# Patient Record
Sex: Female | Born: 1960 | Race: White | Hispanic: No | Marital: Married | Smoking: Former smoker
Health system: Southern US, Community
[De-identification: ages and names within clinical notes are randomized; demographics above are authoritative.]

## PROBLEM LIST (undated history)

## (undated) DIAGNOSIS — Z8659 Personal history of other mental and behavioral disorders: Secondary | ICD-10-CM

## (undated) DIAGNOSIS — I10 Essential (primary) hypertension: Secondary | ICD-10-CM

## (undated) DIAGNOSIS — Z87442 Personal history of urinary calculi: Secondary | ICD-10-CM

## (undated) DIAGNOSIS — M4800 Spinal stenosis, site unspecified: Secondary | ICD-10-CM

## (undated) DIAGNOSIS — I639 Cerebral infarction, unspecified: Secondary | ICD-10-CM

## (undated) DIAGNOSIS — R0789 Other chest pain: Secondary | ICD-10-CM

## (undated) DIAGNOSIS — M1990 Unspecified osteoarthritis, unspecified site: Secondary | ICD-10-CM

## (undated) HISTORY — DX: Spinal stenosis, site unspecified: M48.00

## (undated) HISTORY — DX: Essential (primary) hypertension: I10

## (undated) HISTORY — DX: Other chest pain: R07.89

## (undated) HISTORY — DX: Unspecified osteoarthritis, unspecified site: M19.90

## (undated) HISTORY — PX: ROTATOR CUFF REPAIR: SHX139

---

## 2004-11-09 ENCOUNTER — Ambulatory Visit: Payer: Self-pay | Admitting: Family Medicine

## 2008-04-30 ENCOUNTER — Ambulatory Visit: Payer: Self-pay | Admitting: Unknown Physician Specialty

## 2008-05-03 ENCOUNTER — Ambulatory Visit: Payer: Self-pay | Admitting: Unknown Physician Specialty

## 2008-05-17 ENCOUNTER — Ambulatory Visit: Payer: Self-pay | Admitting: Unknown Physician Specialty

## 2008-05-28 ENCOUNTER — Ambulatory Visit: Payer: Self-pay | Admitting: Unknown Physician Specialty

## 2008-07-19 HISTORY — PX: BREAST CYST EXCISION: SHX579

## 2008-07-19 HISTORY — PX: NOVASURE ABLATION: SHX5394

## 2009-05-13 ENCOUNTER — Ambulatory Visit: Payer: Self-pay | Admitting: Unknown Physician Specialty

## 2010-05-02 ENCOUNTER — Emergency Department: Payer: Self-pay | Admitting: Unknown Physician Specialty

## 2011-06-24 ENCOUNTER — Ambulatory Visit: Payer: Self-pay | Admitting: Physician Assistant

## 2012-07-24 ENCOUNTER — Ambulatory Visit: Payer: Self-pay | Admitting: Obstetrics & Gynecology

## 2012-11-06 ENCOUNTER — Encounter: Payer: Self-pay | Admitting: General Surgery

## 2012-11-06 ENCOUNTER — Ambulatory Visit (INDEPENDENT_AMBULATORY_CARE_PROVIDER_SITE_OTHER): Payer: BC Managed Care – PPO | Admitting: General Surgery

## 2012-11-06 VITALS — BP 122/82 | HR 60 | Resp 12 | Ht 59.0 in | Wt 133.0 lb

## 2012-11-06 DIAGNOSIS — S301XXA Contusion of abdominal wall, initial encounter: Secondary | ICD-10-CM

## 2012-11-06 NOTE — Progress Notes (Signed)
Patient ID: Meredith Weber, female   DOB: 1960-08-30, 52 y.o.   MRN: [id number]  Chief Complaint  Patient presents with  . abdominal hernia    HPI Meredith Weber is a 52 y.o. female here today for evaluation of an possible hernia. The patient states she noticed a knot in March 27,2014. She states the area was bruised and tender to the touch.The day prior to noticing the knot she states she was trying to pushing a roll of carpet to get it off the floor at work. The tenderness is still present but the bruise is gone. She notices if she strains the area is more noticeable. She is having a sensation of bloating but denies constipation. Her bowels seems be slower since this accident. No studies have been done at this point. She was seen at [facility] by Dr. Zachery Dauer who has put her on a 10 lb weight restriction until she could be seen by our office.  HPI  Past Medical History  Diagnosis Date  . Spinal stenosis     Past Surgical History  Procedure Laterality Date  . Cesarean section  1984  . Breast cyst excision Left 2010  . Novasure ablation  2010    History reviewed. No pertinent family history.  Social History History  Substance Use Topics  . Smoking status: Former Smoker -- 1.00 packs/day  . Smokeless tobacco: Current User  . Alcohol Use: Yes    No Known Allergies  Current Outpatient Prescriptions  Medication Sig Dispense Refill  . CAMILA 0.35 MG tablet Take 1 tablet by mouth daily.      . fluticasone (FLONASE) 50 MCG/ACT nasal spray Place 1 spray into the nose 2 (two) times daily.       No current facility-administered medications for this visit.    Review of Systems Review of Systems  Constitutional: Negative.   Respiratory: Negative.   Cardiovascular: Negative.   Gastrointestinal: Positive for abdominal pain.    Blood pressure 122/82, pulse 60, resp. rate 12, height 4\' 11"  (1.499 m), weight 133 lb (60.328 kg).  Physical Exam Physical Exam  Constitutional: She  appears well-developed and well-nourished.  Neck: Trachea normal. No mass and no thyromegaly present.  Cardiovascular: Normal rate, regular rhythm, normal heart sounds and normal pulses.   No murmur heard. Pulmonary/Chest: Effort normal and breath sounds normal.  Abdominal: Soft. Normal appearance and bowel sounds are normal. There is tenderness. No hernia.  Faint residual bruising consistent with a resolving hematoma of the lower abdomen. Abdominal examination was completed in the supine and standing position. No evidence of fascial defect.  Data Reviewed none  Assessment    Abdominal wall hematoma, resolving. No evidence of abdominal wall hernia.     Plan    Conservative treatment has been recommended: Local heat as well as making use of Aleve, 2 tablets b.i.d. For the next 2 weeks. I would anticipate her symptoms will continue to improve.  Should her symptoms persist we'll arrange for CT scan of the abdomen and pelvis.       Meredith Weber 11/07/2012, 9:09 PM  Cc: Richardean Canal, M.D.; [facility].

## 2012-11-06 NOTE — Patient Instructions (Signed)
Patient to return in 3 weeks. She is advised to use Aleve 2 tabs twice daily for 1 week, then 1 tab twice a day until return appointment. She is also instructed to use heat. No restrictions. Patient to use good judgement when lifting, pushing, and pulling.

## 2012-11-07 ENCOUNTER — Encounter: Payer: Self-pay | Admitting: General Surgery

## 2012-11-07 DIAGNOSIS — S301XXA Contusion of abdominal wall, initial encounter: Secondary | ICD-10-CM | POA: Insufficient documentation

## 2012-12-06 ENCOUNTER — Encounter: Payer: Self-pay | Admitting: General Surgery

## 2012-12-06 ENCOUNTER — Ambulatory Visit (INDEPENDENT_AMBULATORY_CARE_PROVIDER_SITE_OTHER): Payer: BC Managed Care – PPO | Admitting: General Surgery

## 2012-12-06 VITALS — BP 152/90 | HR 72 | Resp 12 | Ht 64.0 in | Wt 132.0 lb

## 2012-12-06 DIAGNOSIS — S301XXA Contusion of abdominal wall, initial encounter: Secondary | ICD-10-CM

## 2012-12-06 NOTE — Progress Notes (Signed)
Patient ID: Meredith Weber, female   DO: [birth year], 52 y.o.   MRN: [id number]  Chief Complaint  Patient presents with  . Other    muscle     HPI Meredith Weber is a 52 y.o. female here today for an follow up muscle stain in the abdominal area. Patient still feels a knot.the patient reports that this is more of a small roll at the lower aspect of the abdomen rather a firm nodule. Minimal discomfort in the area. In general, marked improvement in her abdominal wall pain. Ecchymosis is now completely resolved.  HPI  Past Medical History  Diagnosis Date  . Spinal stenosis     Past Surgical History  Procedure Laterality Date  . Cesarean section  1984  . Breast cyst excision Left 2010  . Novasure ablation  2010    No family history on file.  Social History History  Substance Use Topics  . Smoking status: Former Smoker -- 1.00 packs/day  . Smokeless tobacco: Current User  . Alcohol Use: Yes    No Known Allergies  Current Outpatient Prescriptions  Medication Sig Dispense Refill  . CAMILA 0.35 MG tablet Take 1 tablet by mouth daily.      . fluticasone (FLONASE) 50 MCG/ACT nasal spray Place 1 spray into the nose 2 (two) times daily.       No current facility-administered medications for this visit.    Review of Systems Review of Systems  Constitutional: Negative.   Respiratory: Negative.   Cardiovascular: Negative.     Blood pressure 152/90, pulse 72, resp. rate 12, height 5\' 4"  (1.626 m), weight 132 lb (59.875 kg).  Physical Exam Physical Exam  Constitutional: She appears well-developed and well-nourished.  The abdomen is soft and nontender. No focal mass effect. Examination of the supine and standing position shows no discernible fascial defect. A small area of thickened adipose tissue consistent with a hematoma side is identified in the lower midline.  Data Reviewed None  Assessment    Resolving abdominal wall hematoma. No evidence of ventral hernia.     Plan   The patient has been encouraged to make use of local heat and Aleve as needed for comfort. This will also help with the musculoskeletal symptom she is experiencing in the sacroiliac area. At this time, I see nothing to suggest an occult hernia or intra-abdominal process.        Meredith Weber 12/06/2012, 7:17 PM

## 2012-12-06 NOTE — Patient Instructions (Addendum)
The patient is aware to use an antiinflammatory of choice (Advil or Aleve) as needed for comfort.

## 2013-07-27 ENCOUNTER — Ambulatory Visit: Payer: Self-pay

## 2014-05-20 ENCOUNTER — Encounter: Payer: Self-pay | Admitting: General Surgery

## 2014-11-13 ENCOUNTER — Emergency Department
Admit: 2014-11-13 | Disposition: A | Discharge status: Home / Self Care | Payer: Self-pay | Admitting: Emergency Medicine

## 2014-11-13 LAB — BASIC METABOLIC PANEL
Anion Gap: 8 (ref 7–16)
BUN: 18 mg/dL
CALCIUM: 9 mg/dL
CHLORIDE: 105 mmol/L
Co2: 23 mmol/L
Creatinine: 0.55 mg/dL
EGFR (African American): >60
EGFR (Non-African Amer.): >60
Glucose: 102 mg/dL — ABNORMAL HIGH
Potassium: 4 mmol/L
Sodium: 136 mmol/L

## 2014-11-13 LAB — PRO B NATRIURETIC PEPTIDE: B-Type Natriuretic Peptide: 23 pg/mL

## 2014-11-13 LAB — CBC
HCT: 44.3 % (ref 35.0–47.0)
HGB: 14.9 g/dL (ref 12.0–16.0)
MCH: 30.9 pg (ref 26.0–34.0)
MCHC: 33.7 g/dL (ref 32.0–36.0)
MCV: 92 fL (ref 80–100)
PLATELETS: 222 10*3/uL (ref 150–440)
RBC: 4.83 10*6/uL (ref 3.80–5.20)
RDW: 12.8 % (ref 11.5–14.5)
WBC: 6.9 10*3/uL (ref 3.6–11.0)

## 2014-11-13 LAB — D-DIMER(ARMC): D-Dimer: 313 ng/ml

## 2014-11-13 LAB — TROPONIN I
Troponin-I: <0.03 ng/mL
Troponin-I: <0.03 ng/mL

## 2014-11-15 DIAGNOSIS — R0789 Other chest pain: Secondary | ICD-10-CM | POA: Insufficient documentation

## 2014-11-15 DIAGNOSIS — I10 Essential (primary) hypertension: Secondary | ICD-10-CM | POA: Insufficient documentation

## 2019-07-23 DIAGNOSIS — S86919A Strain of unspecified muscle(s) and tendon(s) at lower leg level, unspecified leg, initial encounter: Secondary | ICD-10-CM | POA: Insufficient documentation

## 2019-08-09 ENCOUNTER — Other Ambulatory Visit: Payer: Self-pay | Admitting: Orthopedic Surgery

## 2019-08-09 DIAGNOSIS — S86912A Strain of unspecified muscle(s) and tendon(s) at lower leg level, left leg, initial encounter: Secondary | ICD-10-CM

## 2019-08-27 ENCOUNTER — Ambulatory Visit (HOSPITAL_COMMUNITY): Payer: Self-pay

## 2019-08-27 ENCOUNTER — Encounter (HOSPITAL_COMMUNITY): Payer: Self-pay

## 2019-09-03 ENCOUNTER — Other Ambulatory Visit: Payer: Self-pay

## 2019-09-03 ENCOUNTER — Ambulatory Visit (HOSPITAL_COMMUNITY)
Admission: RE | Admit: 2019-09-03 | Discharge: 2019-09-03 | Disposition: A | Discharge status: Home / Self Care | Payer: Self-pay | Source: Ambulatory Visit | Attending: Orthopedic Surgery | Admitting: Orthopedic Surgery

## 2019-09-03 DIAGNOSIS — S86912A Strain of unspecified muscle(s) and tendon(s) at lower leg level, left leg, initial encounter: Secondary | ICD-10-CM | POA: Insufficient documentation

## 2019-10-11 ENCOUNTER — Ambulatory Visit
Admission: EM | Admit: 2019-10-11 | Discharge: 2019-10-11 | Disposition: A | Discharge status: Home / Self Care | Payer: HRSA Program | Attending: Internal Medicine | Admitting: Internal Medicine

## 2019-10-11 ENCOUNTER — Encounter: Payer: Self-pay | Admitting: Emergency Medicine

## 2019-10-11 ENCOUNTER — Other Ambulatory Visit: Payer: Self-pay

## 2019-10-11 DIAGNOSIS — U071 COVID-19: Secondary | ICD-10-CM | POA: Diagnosis not present

## 2019-10-11 DIAGNOSIS — R062 Wheezing: Secondary | ICD-10-CM

## 2019-10-11 DIAGNOSIS — Z20822 Contact with and (suspected) exposure to covid-19: Secondary | ICD-10-CM | POA: Diagnosis present

## 2019-10-11 DIAGNOSIS — R05 Cough: Secondary | ICD-10-CM

## 2019-10-11 DIAGNOSIS — R197 Diarrhea, unspecified: Secondary | ICD-10-CM

## 2019-10-11 DIAGNOSIS — M545 Low back pain: Secondary | ICD-10-CM

## 2019-10-11 DIAGNOSIS — R110 Nausea: Secondary | ICD-10-CM

## 2019-10-11 LAB — SARS CORONAVIRUS 2 (TAT 6-24 HRS): SARS Coronavirus 2: POSITIVE — AB

## 2019-10-11 LAB — INFLUENZA PANEL BY PCR (TYPE A & B)
Influenza A By PCR: NEGATIVE
Influenza B By PCR: NEGATIVE

## 2019-10-11 MED ORDER — BENZONATATE 200 MG PO CAPS: 200.0000 mg | ORAL_CAPSULE | Freq: Two times a day (BID) | ORAL | 0 refills | Status: DC | PRN

## 2019-10-11 NOTE — ED Triage Notes (Signed)
Patient c/o fever that started on Sunday. She states her temp has been around 99.0. She also c/o cough, headache and generalized body aches that started on Sunday.

## 2019-10-11 NOTE — ED Provider Notes (Signed)
[facility]    CSN: [id number] Arrival date & time: 10/11/19  1006      History   Chief Complaint Chief Complaint  Patient presents with   Cough   Headache    HPI Meredith Weber is a 59 y.o. female. who presents with onset of cough 4 days ago and low grade temp the next and has aches around her back. Cough is non productive and is getting worse. Has mild ST on the L side. Denies rhinitis or ear pain. Her nose feels stuffy. She works with the public and is not aware of being around anyone with COVID. Denies being prone to getting secondary infections.  Denies being sick at all in the past month. She denies all over body aches. Has been fatigued. Her taste has changed, and she cant smell x 2 d. Has been feeling nauseous. She had an episode of sweating this am.  Has been taking Delsym which is helping her cough.    Past Medical History:  Diagnosis Date   Spinal stenosis     Patient Active Problem List   Diagnosis Date Noted   Abdominal wall hematoma 11/07/2012    Past Surgical History:  Procedure Laterality Date   BREAST CYST EXCISION Left 2010   CESAREAN SECTION  1984   NOVASURE ABLATION  2010    OB History    Gravida  1   Para  1   Term      Preterm      AB      Living  1     SAB      TAB      Ectopic      Multiple      Live Births           Obstetric Comments  1st Menstrual Cycle: 11 1st Pregnancy 21         Home Medications    Prior to Admission medications   Medication Sig Start Date End Date Taking? Authorizing Provider  amLODipine (NORVASC) 2.5 MG tablet Take 2.5 mg by mouth daily. 08/06/19  Yes [provider]  carvedilol (COREG) 12.5 MG tablet carvedilol 12.5 mg tablet  TAKE 1 TABLET BY MOUTH TWICE A DAY   Yes [provider]  benzonatate (TESSALON) 200 MG capsule Take 1 capsule (200 mg total) by mouth 2 (two) times daily as needed for cough. 10/11/19   Rodriguez-Southworth, Nettie Elm, PA-C    CAMILA 0.35 MG tablet Take 1 tablet by mouth daily. 08/20/12 10/11/19  [provider]  fluticasone (FLONASE) 50 MCG/ACT nasal spray Place 1 spray into the nose 2 (two) times daily.  10/11/19  [provider]    Family History History reviewed. No pertinent family history.  Social History Social History   Tobacco Use   Smoking status: Former Smoker    Packs/day: 1.00   Smokeless tobacco: Current User  Substance Use Topics   Alcohol use: Yes   Drug use: No     Allergies   Patient has no known allergies.   Review of Systems Review of Systems  Constitutional: Positive for appetite change, diaphoresis, fatigue and fever. Negative for chills.  HENT: Negative for congestion, ear discharge, ear pain, postnasal drip, rhinorrhea, sore throat and trouble swallowing.   Respiratory: Positive for cough and wheezing. Negative for shortness of breath.        She has heard wheezing from her nose and mouth  Cardiovascular: Negative for chest pain.  Gastrointestinal: Positive for diarrhea  and nausea. Negative for constipation and vomiting.  Genitourinary: Negative for difficulty urinating.  Musculoskeletal: Positive for back pain. Negative for myalgias.       Has mild upper back aches  Skin: Negative for rash.  Neurological: Positive for dizziness and headaches. Negative for speech difficulty and numbness.       Has felt a little off balance  Hematological: Negative for adenopathy.     Physical Exam Triage Vital Signs ED Triage Vitals  Enc Vitals Group     BP 10/11/19 1022 128/78     Pulse Rate 10/11/19 1022 (!) 53     Resp 10/11/19 1022 18     Temp 10/11/19 1022 98.5 F (36.9 C)     Temp Source 10/11/19 1022 Oral     SpO2 10/11/19 1022 97 %     Weight 10/11/19 1019 180 lb (81.6 kg)     Height 10/11/19 1019 4\' 10"  (1.473 m)     Head Circumference --      Peak Flow --      Pain Score 10/11/19 1019 0     Pain Loc --      Pain Edu? --      Excl. in [place]? --     No data found.  Updated Vital Signs BP 128/78 (BP Location: Right Arm)    Pulse (!) 53    Temp 98.5 F (36.9 C) (Oral)    Resp 18    Ht 4\' 10"  (1.473 m)    Wt 180 lb (81.6 kg)    SpO2 97%    BMI 37.62 kg/m   Visual Acuity Right Eye Distance:   Left Eye Distance:   Bilateral Distance:    Right Eye Near:   Left Eye Near:    Bilateral Near:     Physical Exam Physical Exam Vitals signs and nursing note reviewed.  Constitutional:      General: She is not in acute distress.    Appearance: Normal appearance. She is not ill-appearing, toxic-appearing or diaphoretic.  HENT:     Head: Normocephalic.     Right Ear: Tympanic membrane, ear canal and external ear normal.     Left Ear: Tympanic membrane, ear canal and external ear normal.     Nose: Nose with mild mucosa congestion.    Mouth/Throat:     Mouth: Mucous membranes are moist.  Eyes:     General: No scleral icterus.       Right eye: No discharge.        Left eye: No discharge.     Conjunctiva/sclera: Conjunctivae normal.  Neck:     Musculoskeletal: Neck supple. No neck rigidity.  Cardiovascular:     Rate and Rhythm: Normal rate and regular rhythm.     Heart sounds: No murmur.  Pulmonary:     Effort: Pulmonary effort is normal.     Breath sounds: Normal breath sounds.  Abdominal:     General: Bowel sounds are normal. There is no distension.     Palpations: Abdomen is soft. There is no mass.     Tenderness: There is no abdominal tenderness. There is no guarding or rebound.     Hernia: No hernia is present.  Musculoskeletal: Normal range of motion.  Lymphadenopathy:     Cervical: No cervical adenopathy.  Skin:    General: Skin is warm and dry.     Coloration: Skin is not jaundiced.     Findings: No rash.  Neurological:     Mental  Status: She is alert and oriented to person, place, and time.     Gait: Gait normal.  Psychiatric:        Mood and Affect: Mood normal.        Behavior: Behavior normal.        Thought  Content: Thought content normal.        Judgment: Judgment normal.   UC Treatments / Results  Labs (all labs ordered are listed, but only abnormal results are displayed) Labs Reviewed  SARS CORONAVIRUS 2 (TAT 6-24 HRS)  INFLUENZA PANEL BY PCR (TYPE A & B)    EKG   Radiology No results found.  Procedures Procedures (including critical care time)  Medications Ordered in UC Medications - No data to display  Initial Impression / Assessment and Plan / UC Course  I have reviewed the triage vital signs and the nursing notes. Pertinent labs imaging results that were available during my care of the patient were reviewed by me and considered in my medical decision making (see chart for details). Flu tests were negative. Covid test is pending. She is to stay Quarantined.  If she gets worse she needs to be seen again.  See instructions.  Final Clinical Impressions(s) / UC Diagnoses   Final diagnoses:  Suspected COVID-19 virus infection     Discharge Instructions     Take the following supplements to help your immune system be stronger to fight this viral infection Take Quarcetin 500 mg three times a day x 7 days with Zinc 50 mg ones a day x 7 days. The quarcetin is an antiviral and anti-inflammatory supplement which helps open the zinc channels in the cell to absorb Zinc. Zinc helps decrease the virus load in your body.  Also make sure to take Vit D 5,000 IU per day with a fatty meal and Vit C 1000 mg a day until you are completely better. Stay on Vitamin D 2,000 the rest of the season.     STAY QUARANTINED IN THE MEAN TIME UNTIL YOUR RESULTS ARE BACK     ED Prescriptions    Medication Sig Dispense Auth. Provider   benzonatate (TESSALON) 200 MG capsule Take 1 capsule (200 mg total) by mouth 2 (two) times daily as needed for cough. 30 capsule Rodriguez-Southworth, Nettie Elm, PA-C     PDMP not reviewed this encounter.   Garey Ham, PA-C 10/11/19 1234

## 2019-10-11 NOTE — Discharge Instructions (Addendum)
Take the following supplements to help your immune system be stronger to fight this viral infection Take Quarcetin 500 mg three times a day x 7 days with Zinc 50 mg ones a day x 7 days. The quarcetin is an antiviral and anti-inflammatory supplement which helps open the zinc channels in the cell to absorb Zinc. Zinc helps decrease the virus load in your body.  Also make sure to take Vit D 5,000 IU per day with a fatty meal and Vit C 1000 mg a day until you are completely better. Stay on Vitamin D 2,000 the rest of the season.     STAY QUARANTINED IN THE MEAN TIME UNTIL YOUR RESULTS ARE BACK

## 2019-10-12 ENCOUNTER — Telehealth: Payer: Self-pay | Admitting: Unknown Physician Specialty

## 2019-10-12 NOTE — Telephone Encounter (Signed)
Called to discuss with patient about Covid symptoms and the use of bamlanivimab, a monoclonal antibody infusion for those with mild to moderate Covid symptoms and at a high risk of hospitalization.  Pt is qualified for this infusion at the [facility] due to BMI>35   Message left to call back  

## 2019-10-12 NOTE — Telephone Encounter (Signed)
Called to discuss with Meredith Weber about Covid symptoms and the use of bamlanivimab, a monoclonal antibody infusion for those with mild to moderate Covid symptoms and at a high risk of hospitalization.     Pt is qualified for this infusion at the [facility] center due to co-morbid conditions and/or a member of an at-risk group, however declines infusion at this time. Symptoms tier reviewed as well as criteria for ending isolation.  Symptoms reviewed that would warrant ED/Hospital evaluation. Preventative practices reviewed. Patient verbalized understanding. Patient advised to call back if he decides that he does want to get infusion. Callback number to the infusion center given. Patient advised to go to Urgent care or ED with severe symptoms. Last date she would be eligible for infusion is 3/29    Patient Active Problem List   Diagnosis Date Noted  . Abdominal wall hematoma 11/07/2012   Sx onset March 20

## 2020-06-18 ENCOUNTER — Other Ambulatory Visit: Payer: Self-pay

## 2020-06-18 ENCOUNTER — Encounter: Payer: Self-pay | Admitting: Emergency Medicine

## 2020-06-18 ENCOUNTER — Ambulatory Visit
Admission: EM | Admit: 2020-06-18 | Discharge: 2020-06-18 | Disposition: A | Discharge status: Home / Self Care | Payer: Self-pay | Attending: Emergency Medicine | Admitting: Emergency Medicine

## 2020-06-18 DIAGNOSIS — J029 Acute pharyngitis, unspecified: Secondary | ICD-10-CM

## 2020-06-18 DIAGNOSIS — H9203 Otalgia, bilateral: Secondary | ICD-10-CM

## 2020-06-18 DIAGNOSIS — J00 Acute nasopharyngitis [common cold]: Secondary | ICD-10-CM

## 2020-06-18 MED ORDER — BENZONATATE 100 MG PO CAPS: 100.0000 mg | ORAL_CAPSULE | Freq: Three times a day (TID) | ORAL | 0 refills | Status: DC

## 2020-06-18 MED ORDER — CETIRIZINE HCL 10 MG PO TABS: 10.0000 mg | ORAL_TABLET | Freq: Every day | ORAL | 0 refills | Status: DC

## 2020-06-18 MED ORDER — FLUTICASONE PROPIONATE 50 MCG/ACT NA SUSP: 2.0000 | Freq: Every day | NASAL | 0 refills | Status: DC

## 2020-06-18 NOTE — Discharge Instructions (Signed)
COVID testing ordered.  It will take between 5-7 days for test results.  Someone will contact you regarding abnormal results.    In the meantime: You should remain isolated in your home for 10 days from symptom onset AND greater than 72 hours after symptoms resolution (absence of fever without the use of fever-reducing medication and improvement in respiratory symptoms), whichever is longer Get plenty of rest and push fluids Tessalon Perles prescribed as needed for cough Use OTC zyrtec for nasal congestion, runny nose, and/or sore throat Use OTC flonase for nasal congestion and runny nose Use medications daily for symptom relief Use OTC medications like ibuprofen or tylenol as needed fever or pain Call or go to the ED if you have any new or worsening symptoms such as fever, cough, shortness of breath, chest tightness, chest pain, turning blue, changes in mental status, etc...  

## 2020-06-18 NOTE — ED Triage Notes (Signed)
Ear pain, runny nose, sore throat and cough that started today

## 2020-06-18 NOTE — ED Provider Notes (Signed)
[facility]   [id number] 06/18/20 Arrival Time: 0843   CC: COVID symptoms  SUBJECTIVE: History from: patient.  Meredith Weber is a 59 y.o. female who presents with ear pain, sore throat, runny nose, and congestion x this morning.  Denies sick exposure to COVID, flu or strep.  Has tried OTC medications without relief.  Denies aggravating factors.  Reports previous covid infeciton in the past. Complains of associated fatigue and chills.  Denies fever, SOB, wheezing, chest pain, nausea, changes in bowel or bladder habits.     ROS: As per HPI.  All other pertinent ROS negative.     Past Medical History:  Diagnosis Date  . Spinal stenosis    Past Surgical History:  Procedure Laterality Date  . BREAST CYST EXCISION Left 2010  . CESAREAN SECTION  1984  . NOVASURE ABLATION  2010   No Known Allergies No current facility-administered medications on file prior to encounter.   Current Outpatient Medications on File Prior to Encounter  Medication Sig Dispense Refill  . amLODipine (NORVASC) 2.5 MG tablet Take 2.5 mg by mouth daily.    . carvedilol (COREG) 12.5 MG tablet carvedilol 12.5 mg tablet  TAKE 1 TABLET BY MOUTH TWICE A DAY    . [DISCONTINUED] CAMILA 0.35 MG tablet Take 1 tablet by mouth daily.     Social History   Socioeconomic History  . Marital status: Married    Spouse name: Not on file  . Number of children: Not on file  . Years of education: Not on file  . Highest education level: Not on file  Occupational History  . Not on file  Tobacco Use  . Smoking status: Former Smoker    Packs/day: 1.00  . Smokeless tobacco: Current User  Substance and Sexual Activity  . Alcohol use: Yes  . Drug use: No  . Sexual activity: Not on file  Other Topics Concern  . Not on file  Social History Narrative  . Not on file   Social Determinants of Health   Financial Resource Strain:   . Difficulty of Paying Living Expenses: Not on file  Food Insecurity:   . Worried  About Programme researcher, broadcasting/film/video in the Last Year: Not on file  . Ran Out of Food in the Last Year: Not on file  Transportation Needs:   . Lack of Transportation (Medical): Not on file  . Lack of Transportation (Non-Medical): Not on file  Physical Activity:   . Days of Exercise per Week: Not on file  . Minutes of Exercise per Session: Not on file  Stress:   . Feeling of Stress : Not on file  Social Connections:   . Frequency of Communication with Friends and Family: Not on file  . Frequency of Social Gatherings with Friends and Family: Not on file  . Attends Religious Services: Not on file  . Active Member of Clubs or Organizations: Not on file  . Attends Banker Meetings: Not on file  . Marital Status: Not on file  Intimate Partner Violence:   . Fear of Current or Ex-Partner: Not on file  . Emotionally Abused: Not on file  . Physically Abused: Not on file  . Sexually Abused: Not on file   History reviewed. No pertinent family history.  OBJECTIVE:  Temp: 98.3F Pulse: 65 bpm Pulse ox: 96% on RA BP: 177/99 mmHg  General appearance: alert; appears mildly fatigued, but nontoxic; speaking in full sentences and tolerating own secretions HEENT: NCAT; Ears:  EACs clear, TMs pearly gray; Eyes: PERRL.  EOM grossly intact. Nose: nares patent without rhinorrhea, Throat: oropharynx clear, tonsils non erythematous or enlarged, uvula midline  Neck: supple without LAD Lungs: unlabored respirations, symmetrical air entry; cough: mild; no respiratory distress; CTAB Heart: regular rate and rhythm.   Skin: warm and dry Psychological: alert and cooperative; normal mood and affect ASSESSMENT & PLAN:  1. Ear pain, bilateral   2. Sore throat   3. Acute rhinitis     Meds ordered this encounter  Medications  . cetirizine (ZYRTEC) 10 MG tablet    Sig: Take 1 tablet (10 mg total) by mouth daily.    Dispense:  30 tablet    Refill:  0    Order Specific Question:   Supervising Provider     Answer:   Eustace Moore [[id number]]  . fluticasone (FLONASE) 50 MCG/ACT nasal spray    Sig: Place 2 sprays into both nostrils daily.    Dispense:  16 g    Refill:  0    Order Specific Question:   Supervising Provider    Answer:   Eustace Moore [[id number]]  . benzonatate (TESSALON) 100 MG capsule    Sig: Take 1 capsule (100 mg total) by mouth every 8 (eight) hours.    Dispense:  21 capsule    Refill:  0    Order Specific Question:   Supervising Provider    Answer:   Eustace Moore [[id number]]   COVID testing ordered.  It will take between 5-7 days for test results.  Someone will contact you regarding abnormal results.    In the meantime: You should remain isolated in your home for 10 days from symptom onset AND greater than 72 hours after symptoms resolution (absence of fever without the use of fever-reducing medication and improvement in respiratory symptoms), whichever is longer Get plenty of rest and push fluids Tessalon Perles prescribed as needed for cough Use OTC zyrtec for nasal congestion, runny nose, and/or sore throat Use OTC flonase for nasal congestion and runny nose Use medications daily for symptom relief Use OTC medications like ibuprofen or tylenol as needed fever or pain Call or go to the ED if you have any new or worsening symptoms such as fever, cough, shortness of breath, chest tightness, chest pain, turning blue, changes in mental status, etc...   Reviewed expectations re: course of current medical issues. Questions answered. Outlined signs and symptoms indicating need for more acute intervention. Patient verbalized understanding. After Visit Summary given.         Rennis Harding, PA-C 06/18/20 0901

## 2020-06-19 LAB — COVID-19, FLU A+B AND RSV
Influenza A, NAA: NOT DETECTED
Influenza B, NAA: NOT DETECTED
RSV, NAA: NOT DETECTED
SARS-CoV-2, NAA: NOT DETECTED

## 2020-08-02 ENCOUNTER — Ambulatory Visit
Admission: EM | Admit: 2020-08-02 | Discharge: 2020-08-02 | Disposition: A | Discharge status: Home / Self Care | Payer: Self-pay | Attending: Emergency Medicine | Admitting: Emergency Medicine

## 2020-08-02 DIAGNOSIS — Z1152 Encounter for screening for COVID-19: Secondary | ICD-10-CM

## 2020-08-02 DIAGNOSIS — J069 Acute upper respiratory infection, unspecified: Secondary | ICD-10-CM

## 2020-08-02 DIAGNOSIS — J029 Acute pharyngitis, unspecified: Secondary | ICD-10-CM

## 2020-08-02 MED ORDER — CETIRIZINE HCL 10 MG PO TABS: 10.0000 mg | ORAL_TABLET | Freq: Every day | ORAL | 0 refills | Status: DC

## 2020-08-02 MED ORDER — PREDNISONE 10 MG PO TABS: 20.0000 mg | ORAL_TABLET | Freq: Every day | ORAL | 0 refills | Status: DC

## 2020-08-02 MED ORDER — FLUTICASONE PROPIONATE 50 MCG/ACT NA SUSP: 1.0000 | Freq: Every day | NASAL | 0 refills | Status: AC

## 2020-08-02 MED ORDER — BENZONATATE 100 MG PO CAPS: 100.0000 mg | ORAL_CAPSULE | Freq: Three times a day (TID) | ORAL | 0 refills | Status: DC | PRN

## 2020-08-02 NOTE — Discharge Instructions (Signed)
COVID-19, flu A/B testing ordered.  It will take between 2-7 days for test results.  Someone will contact you regarding abnormal results.    Get plenty of rest and push fluids Tessalon Perles prescribed for cough Zyrtec for nasal congestion, runny nose, and/or sore throat Flonase for nasal congestion and runny nose Decadron was prescribed Use medications daily for symptom relief Use OTC medications like ibuprofen or tylenol as needed fever or pain Call or go to the ED if you have any new or worsening symptoms such as fever, worsening cough, shortness of breath, chest tightness, chest pain, turning blue, changes in mental status, etc...  

## 2020-08-02 NOTE — ED Provider Notes (Signed)
[facility]   [id number] 08/02/20 Arrival Time: 1026   CC: COVID symptoms  SUBJECTIVE: History from: patient.  Meredith Weber is a 60 y.o. female presented to the urgent care with a complaint of cough, nasal congestion, sore throat and body ache for the past 2 to 3 days.  Denies sick exposure to COVID, flu or strep.  Denies recent travel.  Has tried OTC medication without relief.  Denies alleviating or aggravating factors.  Denies previous symptoms in the past.   Denies fever, chills, fatigue, sinus pain, rhinorrhea, sore throat, SOB, wheezing, chest pain, nausea, changes in bowel or bladder habits.    ROS: As per HPI.  All other pertinent ROS negative.     Past Medical History:  Diagnosis Date  . Spinal stenosis    Past Surgical History:  Procedure Laterality Date  . BREAST CYST EXCISION Left 2010  . CESAREAN SECTION  1984  . NOVASURE ABLATION  2010   No Known Allergies No current facility-administered medications on file prior to encounter.   Current Outpatient Medications on File Prior to Encounter  Medication Sig Dispense Refill  . amLODipine (NORVASC) 2.5 MG tablet Take 2.5 mg by mouth daily.    . carvedilol (COREG) 12.5 MG tablet carvedilol 12.5 mg tablet  TAKE 1 TABLET BY MOUTH TWICE A DAY    . [DISCONTINUED] CAMILA 0.35 MG tablet Take 1 tablet by mouth daily.     Social History   Socioeconomic History  . Marital status: Married    Spouse name: Not on file  . Number of children: Not on file  . Years of education: Not on file  . Highest education level: Not on file  Occupational History  . Not on file  Tobacco Use  . Smoking status: Former Smoker    Packs/day: 1.00  . Smokeless tobacco: Current User  Substance and Sexual Activity  . Alcohol use: Yes  . Drug use: No  . Sexual activity: Not on file  Other Topics Concern  . Not on file  Social History Narrative  . Not on file   Social Determinants of Health   Financial Resource Strain: Not on  file  Food Insecurity: Not on file  Transportation Needs: Not on file  Physical Activity: Not on file  Stress: Not on file  Social Connections: Not on file  Intimate Partner Violence: Not on file   History reviewed. No pertinent family history.  OBJECTIVE:  Vitals:   08/02/20 1058  BP: (!) 165/59  Pulse: 60  Resp: 20  Temp: 98.6 F (37 C)  SpO2: 97%     General appearance: alert; appears fatigued, but nontoxic; speaking in full sentences and tolerating own secretions HEENT: NCAT; Ears: EACs clear, TMs pearly gray; Eyes: PERRL.  EOM grossly intact. Sinuses: nontender; Nose: nares patent without rhinorrhea, Throat: oropharynx clear, tonsils non erythematous or enlarged, uvula midline  Neck: supple without LAD Lungs: unlabored respirations, symmetrical air entry; cough: moderate; no respiratory distress; CTAB Heart: regular rate and rhythm.  Radial pulses 2+ symmetrical bilaterally Skin: warm and dry Psychological: alert and cooperative; normal mood and affect  LABS:  No results found for this or any previous visit (from the past 24 hour(s)).   ASSESSMENT & PLAN:  1. Encounter for screening for COVID-19   2. URI with cough and congestion   3. Sore throat     Meds ordered this encounter  Medications  . predniSONE (DELTASONE) 10 MG tablet    Sig: Take 2 tablets (  20 mg total) by mouth daily.    Dispense:  15 tablet    Refill:  0  . fluticasone (FLONASE) 50 MCG/ACT nasal spray    Sig: Place 1 spray into both nostrils daily for 14 days.    Dispense:  16 g    Refill:  0  . benzonatate (TESSALON) 100 MG capsule    Sig: Take 1 capsule (100 mg total) by mouth 3 (three) times daily as needed for cough.    Dispense:  30 capsule    Refill:  0  . cetirizine (ZYRTEC ALLERGY) 10 MG tablet    Sig: Take 1 tablet (10 mg total) by mouth daily.    Dispense:  30 tablet    Refill:  0    Discharge Instructions   COVID-19, flu A/B testing ordered.  It will take between 2-7 days  for test results.  Someone will contact you regarding abnormal results.    Get plenty of rest and push fluids Tessalon Perles prescribed for cough Zyrtec for nasal congestion, runny nose, and/or sore throat Flonase for nasal congestion and runny nose Decadron was prescribed Use medications daily for symptom relief Use OTC medications like ibuprofen or tylenol as needed fever or pain Call or go to the ED if you have any new or worsening symptoms such as fever, worsening cough, shortness of breath, chest tightness, chest pain, turning blue, changes in mental status, etc...   Reviewed expectations re: course of current medical issues. Questions answered. Outlined signs and symptoms indicating need for more acute intervention. Patient verbalized understanding. After Visit Summary given.         Durward Parcel, FNP 08/02/20 1113

## 2020-08-02 NOTE — ED Triage Notes (Signed)
Pt presents with nasal congestion, sore throat and body aches that began Thursday

## 2020-08-07 LAB — COVID-19, FLU A+B NAA
Influenza A, NAA: NOT DETECTED
Influenza B, NAA: NOT DETECTED
SARS-CoV-2, NAA: NOT DETECTED

## 2021-04-08 DIAGNOSIS — I619 Nontraumatic intracerebral hemorrhage, unspecified: Secondary | ICD-10-CM

## 2021-04-08 HISTORY — DX: Nontraumatic intracerebral hemorrhage, unspecified: I61.9

## 2021-06-09 ENCOUNTER — Other Ambulatory Visit: Payer: Self-pay

## 2021-06-09 ENCOUNTER — Ambulatory Visit (HOSPITAL_COMMUNITY): Payer: Self-pay | Attending: Internal Medicine | Admitting: Physical Therapy

## 2021-06-09 ENCOUNTER — Encounter (HOSPITAL_COMMUNITY): Payer: Self-pay | Admitting: Physical Therapy

## 2021-06-09 DIAGNOSIS — R29898 Other symptoms and signs involving the musculoskeletal system: Secondary | ICD-10-CM | POA: Insufficient documentation

## 2021-06-09 DIAGNOSIS — M6281 Muscle weakness (generalized): Secondary | ICD-10-CM | POA: Insufficient documentation

## 2021-06-09 DIAGNOSIS — R262 Difficulty in walking, not elsewhere classified: Secondary | ICD-10-CM | POA: Insufficient documentation

## 2021-06-09 NOTE — Therapy (Signed)
[facility] [address] [place], [place], [address] Phone: [phone]   Fax:  [phone]  Physical Therapy Evaluation  Patient Details  Name: Meredith Weber MRN: [id number] Date of Birth: May 02, 1961 Referring Provider (PT): Alvina Filbert MD   Encounter Date: 06/09/2021   PT End of Session - 06/09/21 1026     Visit Number 1    Number of Visits 8    Date for PT Re-Evaluation 08/04/21    Authorization Type self pay $25    PT Start Time 1045    PT Stop Time 1120    PT Time Calculation (min) 35 min    Activity Tolerance Patient limited by fatigue    Behavior During Therapy [facility] for tasks assessed/performed             Past Medical History:  Diagnosis Date   Spinal stenosis     Past Surgical History:  Procedure Laterality Date   BREAST CYST EXCISION Left 2010   CESAREAN SECTION  1984   NOVASURE ABLATION  2010    There were no vitals filed for this visit.    Subjective Assessment - 06/09/21 1051     Subjective States she had a stroke on 9/21 and is still having trouble with her right side. States that her right leg can't pick it up very high. States that she has difficulty writing and using her right arm. States dressing is difficult.    Pertinent History CVA    Patient Stated Goals to be able to walk around grocery store without being wore out and to get in and out of the shower with ease (bath/shower combo).    Currently in Pain? Yes    Pain Score 9     Pain Location Back    Pain Orientation Lower    Pain Descriptors / Indicators Aching    Pain Type Chronic pain    Pain Frequency Constant    Aggravating Factors  walking, standing    Pain Relieving Factors rest                [facility] PT Assessment - 06/09/21 0001       Assessment   Medical Diagnosis s/p CVa    Referring Provider (PT) Alvina Filbert MD    Onset Date/Surgical Date 04/08/21    Prior Therapy yes for her shoulder      Balance Screen   Has  the patient fallen in the past 6 months No      Home Environment   Living Environment Private residence    Living Arrangements Alone    Available Help at Discharge Family    Type of Home House    Home Access Stairs to enter    Entrance Stairs-Number of Steps 5    Entrance Stairs-Rails Can reach both    Home Layout One level    Home Equipment None      Prior Function   Level of Independence Independent    Vocation Unemployed    Leisure read, walk      Cognition   Overall Cognitive Status Within Functional Limits for tasks assessed      Observation/Other Assessments   Observations pitting edema 4+ bilateral lower estremities    Focus on Therapeutic Outcomes (FOTO)  40% function      ROM / Strength   AROM / PROM / Strength Strength;PROM      PROM   PROM Assessment Site Hip    Right/Left Hip Left;Right  Right Hip Extension --   limited   Left Hip Extension --   limited     Strength   Strength Assessment Site Knee;Hip;Ankle    Right/Left Hip Left;Right    Right Hip Flexion 3+/5   just gives out suddenly with resistance   Right Hip Extension 2+/5   limited in ROM   Left Hip Flexion 4/5    Left Hip Extension 2+/5   limited in ROM   Right/Left Knee Right;Left    Right Knee Flexion 3+/5    Right Knee Extension 3+/5    Left Knee Flexion 3+/5    Left Knee Extension 3+/5    Right/Left Ankle Right;Left    Right Ankle Dorsiflexion 3+/5    Left Ankle Dorsiflexion 4-/5      Bed Mobility   Bed Mobility Rolling Right;Rolling Left;Right Sidelying to Sit;Left Sidelying to Sit;Supine to Sit;Sit to Supine    Rolling Right --   modified independence   Rolling Left --   modified independence   Right Sidelying to Sit --   modified independence   Left Sidelying to Sit --   modified independence   Supine to Sit --   modified independence   Sit to Supine --   modified independence     Transfers   Transfers Sit to Stand;Stand to Sit    Sit to Stand 6: Modified independent  (Device/Increase time);Without upper extremity assist    Stand to Sit 6: Modified independent (Device/Increase time);Without upper extremity assist      Ambulation/Gait   Ambulation/Gait Yes    Ambulation/Gait Assistance 6: Modified independent (Device/Increase time)    Ambulation Distance (Feet) 276 Feet    Assistive device None    Gait Pattern Decreased arm swing - right;Decreased stride length;Decreased hip/knee flexion - right;Trunk flexed;Poor foot clearance - right   reduced trunk rotation   Gait Comments                        Objective measurements completed on examination: See above findings.       [facility] Adult PT Treatment/Exercise - 06/09/21 0001       Exercises   Exercises Knee/Hip      Knee/Hip Exercises: Supine   Bridges 3 sets;5 reps    Straight Leg Raises Both;3 sets;5 reps                     PT Education - 06/09/21 1110     Education Details on current presentation, on HEP, on difference between PT and OT, on causes of cramps    Person(s) Educated Patient    Methods Explanation    Comprehension Verbalized understanding              PT Short Term Goals - 06/09/21 1103       PT SHORT TERM GOAL #1   Title Patient will be independent in self management strategies to improve quality of life and functional outcomes.    Time 3    Period Weeks    Status New    Target Date 07/07/21      PT SHORT TERM GOAL #2   Title Patient will report at least 25% improvement in overall symptoms and/or function to demonstrate improved functional mobility    Period Weeks    Status New    Target Date 07/07/21      PT SHORT TERM GOAL #3   Title Patient will be able to get in  and out of the car with ease and without reporting difficulty lifting up her right leg.    Time 4    Period Weeks    Status New    Target Date 07/07/21               PT Long Term Goals - 06/09/21 1103       PT LONG TERM GOAL #1   Title Patient will  report at least 50% improvement in overall symptoms and/or function to demonstrate improved functional mobility    Time 8    Period Weeks    Status New    Target Date 08/04/21      PT LONG TERM GOAL #2   Title Patient will meet predicted FOTO score to demonstrate improved overall function.    Time 8    Period Weeks    Status New    Target Date 08/04/21      PT LONG TERM GOAL #3   Title Patient will be able to walk around the grocery store for 20 minutes without reporting severe fatigue.    Time 8    Period Weeks    Status New    Target Date 08/04/21                    Plan - 06/09/21 1125     Clinical Impression Statement Patient is a 60 y.o. female who presents to physical therapy with complaint of Right lower extremity weakness after a stoke on 04/08/21. Patient demonstrates decreased strength, ROM restriction, balance deficits and gait abnormalities which are likely contributing to symptoms of pain and are negatively impacting patient ability to perform ADLs and functional mobility tasks. Patient will benefit from skilled physical therapy services to address these deficits to reduce pain, improve level of function with ADLs, functional mobility tasks, and reduce risk for falls.    Personal Factors and Comorbidities Comorbidity 1    Comorbidities B edema, chronic back pain    Examination-Activity Limitations Locomotion Level;Transfers;Lift;Stand;Stairs;Squat    Examination-Participation Restrictions Community Activity;Occupation;Shop;Cleaning;Church    Stability/Clinical Decision Making Stable/Uncomplicated    Clinical Decision Making Low    Rehab Potential Good    PT Frequency --   1-2x/week for total of 8 visits over 8 week certificaiton   PT Duration 8 weeks    PT Treatment/Interventions Cryotherapy;ADLs/Self Care Home Management;Neuromuscular re-education;Patient/family education;Therapeutic exercise;Therapeutic activities;Dry needling;Gait training;Balance  training;Moist Heat;Joint Manipulations    PT Next Visit Plan R LE strength, endurance and core    PT Home Exercise Plan bridges, SLR             Patient will benefit from skilled therapeutic intervention in order to improve the following deficits and impairments:  Pain, Decreased range of motion, Decreased balance, Difficulty walking, Decreased mobility, Decreased activity tolerance, Decreased strength, Decreased knowledge of use of DME, Postural dysfunction, Decreased endurance  Visit Diagnosis: Muscle weakness (generalized)  Difficulty in walking, not elsewhere classified  Right leg weakness     Problem List Patient Active Problem List   Diagnosis Date Noted   Abdominal wall hematoma 11/07/2012    11:30 AM, 06/09/21 Tereasa Coop, DPT Physical Therapy with [facility]  [phone] office   [facility] [address] [place], [place], [address] Phone: [phone]   Fax:  [phone]  Name: DAYANNARA PASCAL MRN: [id number] Date of Birth: 01/09/1961

## 2021-06-10 ENCOUNTER — Encounter: Payer: Self-pay | Admitting: Urology

## 2021-06-10 ENCOUNTER — Ambulatory Visit (INDEPENDENT_AMBULATORY_CARE_PROVIDER_SITE_OTHER): Payer: Self-pay | Admitting: Urology

## 2021-06-10 VITALS — BP 147/83 | HR 63 | Temp 98.6°F | Wt 213.0 lb

## 2021-06-10 DIAGNOSIS — N3941 Urge incontinence: Secondary | ICD-10-CM

## 2021-06-10 DIAGNOSIS — R3129 Other microscopic hematuria: Secondary | ICD-10-CM

## 2021-06-10 LAB — URINALYSIS, ROUTINE W REFLEX MICROSCOPIC
Bilirubin, UA: NEGATIVE
Glucose, UA: NEGATIVE
Ketones, UA: NEGATIVE
Leukocytes,UA: NEGATIVE
Nitrite, UA: NEGATIVE
Specific Gravity, UA: 1.025 (ref 1.005–1.030)
Urobilinogen, Ur: 2 mg/dL — ABNORMAL HIGH (ref 0.2–1.0)
pH, UA: 6 (ref 5.0–7.5)

## 2021-06-10 LAB — MICROSCOPIC EXAMINATION: Renal Epithel, UA: NONE SEEN /hpf

## 2021-06-10 LAB — BLADDER SCAN AMB NON-IMAGING: PVR: 0 WU

## 2021-06-10 NOTE — Progress Notes (Signed)
post void residual = 0 ml

## 2021-06-10 NOTE — Progress Notes (Signed)
Urological Symptom Review  Patient is experiencing the following symptoms: Leakage of urine Blood in urine   Review of Systems  Gastrointestinal (upper)  : Negative for upper GI symptoms  Gastrointestinal (lower) : Negative for lower GI symptoms  Constitutional : Negative for symptoms  Skin: Negative for skin symptoms  Eyes: Negative for eye symptoms  Ear/Nose/Throat : Negative for Ear/Nose/Throat symptoms  Hematologic/Lymphatic: Negative for Hematologic/Lymphatic symptoms  Cardiovascular : Leg swelling  Respiratory : Negative for respiratory symptoms  Endocrine: Negative for endocrine symptoms  Musculoskeletal: Back pain Joint pain  Neurological: Negative for neurological symptoms  Psychologic: Negative for psychiatric symptoms

## 2021-06-10 NOTE — Progress Notes (Signed)
And   Assessment: 1. Microscopic hematuria   2. Urge incontinence      Plan: I reviewed outside records from [facility]. Today I had a discussion with the patient regarding the findings of microscopic hematuria including the implications and differential diagnoses associated with it.  I also discussed recommendations for further evaluation including the rationale for upper tract imaging and cystoscopy.  I discussed the nature of these procedures including potential risk and complications.  The patient expressed an understanding of these issues. Recommend further evaluation with CT hematuria protocol and cystoscopy Request recent labs from [facility] Her urge incontinence is not bothersome for her and she is not interested in any treatment at this time.  Chief Complaint:  Chief Complaint  Patient presents with   Hematuria    History of Present Illness:  Meredith Weber is a 60 y.o. year old female who is seen in consultation from [facility] for evaluation of microscopic hematuria.  Urinalysis results: 09/24/2020: 2 RBCs 04/08/2021: 5 RBCs 05/25/2021: Small blood on dipstick No history of gross hematuria.  She reports possibly noting some traces of blood on her toilet paper in September 2022.  She does have a remote history of kidney stones as a teenager.  No recent stone symptoms.  No recent UTIs.  She does have some chronic low back pain.  No dysuria.  She does have occasional urge incontinence but does not use any pads.  She is optically bothered by this. No history of tobacco use.  Past Medical History:  Past Medical History:  Diagnosis Date   Spinal stenosis     Past Surgical History:  Past Surgical History:  Procedure Laterality Date   BREAST CYST EXCISION Left 2010   CESAREAN SECTION  1984   [place] ABLATION  2010    Allergies:  No Known Allergies  Family History:  No family history on file.  Social  History:  Social History   Tobacco Use   Smoking status: Former    Packs/day: 1.00    Types: Cigarettes   Smokeless tobacco: Current  Substance Use Topics   Alcohol use: Yes   Drug use: No    Review of symptoms:  Constitutional:  Negative for unexplained weight loss, night sweats, fever, chills ENT:  Negative for nose bleeds, sinus pain, painful swallowing CV:  Negative for chest pain, shortness of breath, exercise intolerance, palpitations, loss of consciousness Resp:  Negative for cough, wheezing, shortness of breath GI:  Negative for nausea, vomiting, diarrhea, bloody stools GU:  Positives noted in HPI; otherwise negative for gross hematuria, dysuria Neuro:  Negative for seizures, poor balance, limb weakness, slurred speech Psych:  Negative for lack of energy, depression, anxiety Endocrine:  Negative for polydipsia, polyuria, symptoms of hypoglycemia (dizziness, hunger, sweating) Hematologic:  Negative for anemia, purpura, petechia, prolonged or excessive bleeding, use of anticoagulants  Allergic:  Negative for difficulty breathing or choking as a result of exposure to anything; no shellfish allergy; no allergic response (rash/itch) to materials, foods  Physical exam: BP (!) 147/83   Pulse 63   Temp 98.6 F (37 C)   Wt 213 lb (96.6 kg)   BMI 44.52 kg/m  GENERAL APPEARANCE:  Well appearing, well developed, well nourished, NAD HEENT: Atraumatic, Normocephalic, oropharynx clear. NECK: Supple without lymphadenopathy or thyromegaly. LUNGS: Clear to auscultation bilaterally. HEART: Regular Rate and Rhythm without murmurs, gallops, or rubs. ABDOMEN: Soft, non-tender, No Masses. EXTREMITIES: Moves all extremities well.  Without clubbing, cyanosis, or edema. NEUROLOGIC:  Alert and oriented x 3, normal gait, CN II-XII grossly intact.  MENTAL STATUS:  Appropriate. BACK:  Non-tender to palpation.  No CVAT SKIN:  Warm, dry and intact.    Results: U/A:  0-5 WBC, 3-10 RBC, few  bacteria  Results for orders placed or performed in visit on 06/10/21 (from the past 24 hour(s))  BLADDER SCAN AMB NON-IMAGING   Collection Time: 06/10/21 10:43 AM  Result Value Ref Range   PVR 0.0 WU

## 2021-06-16 ENCOUNTER — Ambulatory Visit (HOSPITAL_COMMUNITY): Payer: Self-pay | Admitting: Physical Therapy

## 2021-06-16 ENCOUNTER — Other Ambulatory Visit: Payer: Self-pay

## 2021-06-16 DIAGNOSIS — M6281 Muscle weakness (generalized): Secondary | ICD-10-CM

## 2021-06-16 DIAGNOSIS — R262 Difficulty in walking, not elsewhere classified: Secondary | ICD-10-CM

## 2021-06-16 DIAGNOSIS — R29898 Other symptoms and signs involving the musculoskeletal system: Secondary | ICD-10-CM

## 2021-06-16 NOTE — Therapy (Signed)
[facility] [address] [place], [place], [address] Phone: [phone]   Fax:  [phone]  Physical Therapy Treatment  Patient Details  Name: Meredith Weber MRN: [id number] Date of Birth: 04/14/1961 Referring Provider (PT): Alvina Filbert MD   Encounter Date: 06/16/2021   PT End of Session - 06/16/21 1144     Visit Number 2    Number of Visits 8    Date for PT Re-Evaluation 08/04/21    Authorization Type self pay $25    PT Start Time 1008    PT Stop Time 1050    PT Time Calculation (min) 42 min    Activity Tolerance Patient limited by fatigue    Behavior During Therapy [facility] for tasks assessed/performed             Past Medical History:  Diagnosis Date   Spinal stenosis     Past Surgical History:  Procedure Laterality Date   BREAST CYST EXCISION Left 2010   CESAREAN SECTION  1984   NOVASURE ABLATION  2010    There were no vitals filed for this visit.   Subjective Assessment - 06/16/21 1015     Subjective pt states she did some of her exercises over the weekend.  Reports pain in her back today 0/10 at rest, 8/10 with activity.    Currently in Pain? Yes    Pain Location Back    Pain Orientation Lower    Pain Descriptors / Indicators Aching    Aggravating Factors  activity                               [facility] Adult PT Treatment/Exercise - 06/16/21 0001       Knee/Hip Exercises: Standing   Heel Raises Both;20 reps    Heel Raises Limitations toeraises 20 times    Hip Flexion Both;10 reps    Hip Flexion Limitations marching    Other Standing Knee Exercises hip abduction 2X10 reps      Knee/Hip Exercises: Supine   Bridges 2 sets;10 reps    Straight Leg Raises Both;3 sets;5 reps      Knee/Hip Exercises: Sidelying   Hip ABduction Both;2 sets;10 reps      Knee/Hip Exercises: Prone   Hamstring Curl 2 sets;10 reps    Other Prone Exercises heelsqueeze 2X10 5" holds                      PT Education - 06/16/21 1142     Education Details review goals, HEP and POC moving forward.  Edcucated on compression garments and given measurements to call [employer] and order    Person(s) Educated Patient    Methods Explanation;Demonstration;Tactile cues;Verbal cues    Comprehension Verbalized understanding;Returned demonstration;Verbal cues required              PT Short Term Goals - 06/16/21 1042       PT SHORT TERM GOAL #1   Title Patient will be independent in self management strategies to improve quality of life and functional outcomes.    Time 3    Period Weeks    Status On-going    Target Date 07/07/21      PT SHORT TERM GOAL #2   Title Patient will report at least 25% improvement in overall symptoms and/or function to demonstrate improved functional mobility    Period Weeks    Status On-going  Target Date 07/07/21      PT SHORT TERM GOAL #3   Title Patient will be able to get in and out of the car with ease and without reporting difficulty lifting up her right leg.    Time 4    Period Weeks    Status On-going    Target Date 07/07/21               PT Long Term Goals - 06/16/21 1043       PT LONG TERM GOAL #1   Title Patient will report at least 50% improvement in overall symptoms and/or function to demonstrate improved functional mobility    Time 8    Period Weeks    Status On-going      PT LONG TERM GOAL #2   Title Patient will meet predicted FOTO score to demonstrate improved overall function.    Time 8    Period Weeks    Status On-going      PT LONG TERM GOAL #3   Title Patient will be able to walk around the grocery store for 20 minutes without reporting severe fatigue.    Time 8    Period Weeks    Status On-going                   Plan - 06/16/21 1143     Clinical Impression Statement Reviewed goals, HEP and POC moving forward.  Educated on compression garments and measured distal LE for knee highs.  Pt  given flier and information on ordering from [employer].  Pt unable to complete hip extension due to weakness with modification to complete heelsqueeze in prone.  Began multiple other activities to target weak hip and LE muscles.  Pt reported overall fatigue and needed several rest breaks during session today.    Personal Factors and Comorbidities Comorbidity 1    Comorbidities B edema, chronic back pain    Examination-Activity Limitations Locomotion Level;Transfers;Lift;Stand;Stairs;Squat    Examination-Participation Restrictions Community Activity;Occupation;Shop;Cleaning;Church    Stability/Clinical Decision Making Stable/Uncomplicated    Rehab Potential Good    PT Frequency --   1-2x/week for total of 8 visits over 8 week certificaiton   PT Duration 8 weeks    PT Treatment/Interventions Cryotherapy;ADLs/Self Care Home Management;Neuromuscular re-education;Patient/family education;Therapeutic exercise;Therapeutic activities;Dry needling;Gait training;Balance training;Moist Heat;Joint Manipulations    PT Next Visit Plan continue to check for OT order; R LE strength, endurance and core.  F/U on compression garments.    PT Home Exercise Plan bridges, SLR             Patient will benefit from skilled therapeutic intervention in order to improve the following deficits and impairments:  Pain, Decreased range of motion, Decreased balance, Difficulty walking, Decreased mobility, Decreased activity tolerance, Decreased strength, Decreased knowledge of use of DME, Postural dysfunction, Decreased endurance  Visit Diagnosis: Muscle weakness (generalized)  Difficulty in walking, not elsewhere classified  Right leg weakness     Problem List Patient Active Problem List   Diagnosis Date Noted   Microscopic hematuria 06/10/2021   Urge incontinence 06/10/2021   Abdominal wall hematoma 11/07/2012   Teena Irani, PTA/CLT, WTA [phone]  Teena Irani, PTA 06/16/2021, 11:45  AM  [place], [place], [address] Phone: [phone]   Fax:  [phone]  Name: Meredith Weber MRN: [id number] Date of Birth: 13-Jan-1961

## 2021-06-23 ENCOUNTER — Ambulatory Visit (HOSPITAL_COMMUNITY): Payer: Self-pay

## 2021-06-23 ENCOUNTER — Other Ambulatory Visit: Payer: Self-pay

## 2021-06-23 ENCOUNTER — Encounter (HOSPITAL_COMMUNITY): Payer: Self-pay

## 2021-06-23 ENCOUNTER — Ambulatory Visit (HOSPITAL_COMMUNITY): Payer: Self-pay | Attending: Internal Medicine

## 2021-06-23 DIAGNOSIS — M6281 Muscle weakness (generalized): Secondary | ICD-10-CM | POA: Insufficient documentation

## 2021-06-23 DIAGNOSIS — R278 Other lack of coordination: Secondary | ICD-10-CM

## 2021-06-23 DIAGNOSIS — R262 Difficulty in walking, not elsewhere classified: Secondary | ICD-10-CM | POA: Insufficient documentation

## 2021-06-23 DIAGNOSIS — R29898 Other symptoms and signs involving the musculoskeletal system: Secondary | ICD-10-CM

## 2021-06-23 NOTE — Therapy (Signed)
[facility] [address] [place], [place], [address] Phone: [phone]   Fax:  [phone]  Occupational Therapy Evaluation  Patient Details  Name: Meredith Weber MRN: [id number] Date of Birth: 1961/03/16 Referring Provider (OT): Alvina Filbert, MD   Encounter Date: 06/23/2021   OT End of Session - 06/23/21 1428     Visit Number 1    Number of Visits 1    Authorization Type Self pay    Authorization Time Period $25 at each visit    OT Start Time 1300    OT Stop Time 1350    OT Time Calculation (min) 50 min    Activity Tolerance Patient tolerated treatment well    Behavior During Therapy [facility] for tasks assessed/performed             Past Medical History:  Diagnosis Date   Spinal stenosis     Past Surgical History:  Procedure Laterality Date   BREAST CYST EXCISION Left 2010   CESAREAN SECTION  1984   NOVASURE ABLATION  2010    There were no vitals filed for this visit.   Subjective Assessment - 06/23/21 1308     Subjective  S: I feel like I don't have the strength.    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 Patient has been receiving PT services at this clinic since November. Dr. Durene Cal has referred patient to occupational therapy for evaluation and treatment.    Patient Stated Goals to increase right UE strength.    Currently in Pain? Yes    Pain Score 8     Pain Location Back    Pain Orientation Lower    Pain Descriptors / Indicators Aching    Pain Type Chronic pain    Pain Onset More than a month ago   at least 10 years. Worse since CVA   Pain Frequency Constant    Aggravating Factors  increased activity. maybe arthritis.    Pain Relieving Factors nothing    Effect of Pain on Daily Activities severe effect    Multiple Pain Sites No               [facility] OT Assessment - 06/23/21 1309       Assessment   Medical Diagnosis Right side weakness     Referring Provider (OT) Alvina Filbert, MD    Onset Date/Surgical Date 04/08/21    Hand Dominance Right    Next MD Visit 06/29/21    Prior Therapy therapy for right shoulder (2014) [facility]. No surgery.      Precautions   Precautions None      Restrictions   Weight Bearing Restrictions No      Balance Screen   Has the patient fallen in the past 6 months No      Home  Environment   Family/patient expects to be discharged to: Private residence    Living Arrangements Spouse/significant other      Prior Function   Level of Independence Independent    Vocation Unemployed      ADL   ADL comments Difficulty with reaching up over head, lifting, grasping and holding onto items. toilet hygiene.      Mobility   Mobility Status Independent      Written Expression   Dominant Hand Right      Vision - History   Baseline Vision Wears glasses all the time  Observation/Other Assessments   Focus on Therapeutic Outcomes (FOTO)  N/A      Sensation   Additional Comments Pt reports that her sensation feels decreased on the right UE. History of bilateral CTS.      Coordination   9 Hole Peg Test Right;Left    Right 9 Hole Peg Test 26.8"    Left 9 Hole Peg Test 22.6"      ROM / Strength   AROM / PROM / Strength AROM;Strength      AROM   Overall AROM Comments Assessed standing. IR/er abducted    AROM Assessment Site Shoulder    Right/Left Shoulder Right    Right Shoulder Flexion 140 Degrees    Right Shoulder ABduction 140 Degrees    Right Shoulder Internal Rotation 22 Degrees    Right Shoulder External Rotation 60 Degrees      Strength   Overall Strength Comments Assessed standing/seated. IR/er adducted    Strength Assessment Site Shoulder;Hand    Right/Left Shoulder Right    Right Shoulder Flexion 5/5    Right Shoulder ABduction 5/5    Right Shoulder Internal Rotation 5/5    Right Shoulder External Rotation 5/5    Right/Left hand Right;Left    Right Hand Grip (lbs) 9     Right Hand Lateral Pinch 7 lbs    Right Hand 3 Point Pinch 5 lbs    Left Hand Grip (lbs) 27   history of CTS and arthritis with decreased strength at baseline   Left Hand Lateral Pinch 9 lbs    Left Hand 3 Point Pinch 5 lbs                              OT Education - 06/23/21 1427     Education Details provided HEP: yellow putty, red band shoulder strengthening, coordination activities.    Person(s) Educated Patient    Methods Explanation;Handout;Demonstration    Comprehension Verbalized understanding              OT Short Term Goals - 06/23/21 1451       OT SHORT TERM GOAL #1   Title Patient will be educated and verbalize understanding of HEP in order to work on right UE weakness and work on increasing functional performance while using right arm as dominant extremity.    Time 1    Period Days    Status Achieved    Target Date 06/23/21                      Plan - 06/23/21 1429     Clinical Impression Statement A: Patient is a 60 y/o female S/P Left CVA experiencing right side weakness such as decrease shoulder endurance, coordination and speed, and hand strength resulting in difficulty completing daily tasks such as grasping and holding onto items, toilet hygiene. and every day self care tasks. Provided HEP with patient verbalizing understanding. patient feels confident with completing HEP at home to focus on right side weakness. All education was completed.    OT Occupational Profile and History Problem Focused Assessment - Including review of records relating to presenting problem    Occupational performance deficits (Please refer to evaluation for details): ADL's;IADL's;Leisure    Body Structure / Function / Physical Skills ADL;Strength;Dexterity;Coordination;FMC;UE functional use    Rehab Potential Excellent    Clinical Decision Making Limited treatment options, no task modification necessary    Comorbidities Affecting Occupational  Performance: Presence of comorbidities impacting occupational performance    Comorbidities impacting occupational performance description: see medical chart    Modification or Assistance to Complete Evaluation  No modification of tasks or assist necessary to complete eval    OT Frequency One time visit    OT Treatment/Interventions Patient/family education    Plan P: One time visit for HEP.    OT Home Exercise Plan eval: coordination, yellow putty, red band shoulder strengthening    Consulted and Agree with Plan of Care Patient             Patient will benefit from skilled therapeutic intervention in order to improve the following deficits and impairments:   Body Structure / Function / Physical Skills: ADL, Strength, Dexterity, Coordination, FMC, UE functional use       Visit Diagnosis: Other symptoms and signs involving the musculoskeletal system - Plan: Ot plan of care cert/re-cert  Other lack of coordination - Plan: Ot plan of care cert/re-cert    Problem List Patient Active Problem List   Diagnosis Date Noted   Microscopic hematuria 06/10/2021   Urge incontinence 06/10/2021   Abdominal wall hematoma 11/07/2012    Ailene Ravel, OTR/L,CBIS  [phone]  06/23/2021, 3:01 PM  [place], [place], [address] Phone: [phone]   Fax:  [phone]  Name: AMAAL DESPRES MRN: [id number] Date of Birth: 1961-02-12

## 2021-06-23 NOTE — Therapy (Signed)
[place], [place], [address] Phone: [phone]   Fax:  [phone]  Physical Therapy Treatment  Patient Details  Name: Meredith Weber MRN: [id number] Date of Birth: June 10, 1961 Referring Provider (PT): Abran Richard MD   Encounter Date: 06/23/2021   PT End of Session - 06/23/21 1028     Visit Number 3    Number of Visits 8    Date for PT Re-Evaluation 08/04/21    Authorization Type self pay $25    PT Start Time 1030    PT Stop Time 1115    PT Time Calculation (min) 45 min    Activity Tolerance Patient limited by fatigue    Behavior During Therapy [facility] for tasks assessed/performed             Past Medical History:  Diagnosis Date   Spinal stenosis     Past Surgical History:  Procedure Laterality Date   BREAST CYST EXCISION Left 2010   [place]  2010    There were no vitals filed for this visit.   Subjective Assessment - 06/23/21 1031     Subjective Pt notes continued RLE weakness and difficulty with activities such as getting in/out of bathtub and in/out of car    Pertinent History CVA    Patient Stated Goals to be able to walk around grocery store without being wore out and to get in and out of the shower with ease (bath/shower combo).    Currently in Pain? Yes    Pain Score 8     Pain Location Back    Pain Orientation Lower    Pain Descriptors / Indicators Aching    Pain Type Chronic pain                [facility] PT Assessment - 06/23/21 0001       Assessment   Medical Diagnosis s/p CVA    Referring Provider (PT) Abran Richard MD    Onset Date/Surgical Date 04/08/21                           [facility] Adult PT Treatment/Exercise - 06/23/21 0001       Knee/Hip Exercises: Standing   Heel Raises Both;20 reps    Hip Flexion Stengthening;Right;2 sets;10 reps    Hip Flexion Limitations marching with counter top support    Other  Standing Knee Exercises hip abduction 2X10 reps      Knee/Hip Exercises: Seated   Marching Strengthening;Right;2 sets;10 reps      Knee/Hip Exercises: Supine   Bridges 2 sets;10 reps    Straight Leg Raises Strengthening;Right;2 sets;10 reps    Straight Leg Raises Limitations use of bed pillow to limit eccentric/height      Knee/Hip Exercises: Sidelying   Hip ABduction Both;2 sets;10 reps      Knee/Hip Exercises: Prone   Hamstring Curl 2 sets;10 reps    Other Prone Exercises heelsqueeze 2X10 5" holds                       PT Short Term Goals - 06/16/21 1042       PT SHORT TERM GOAL #1   Title Patient will be independent in self management strategies to improve quality of life and functional outcomes.    Time 3    Period Weeks    Status On-going  Target Date 07/07/21      PT SHORT TERM GOAL #2   Title Patient will report at least 25% improvement in overall symptoms and/or function to demonstrate improved functional mobility    Period Weeks    Status On-going    Target Date 07/07/21      PT SHORT TERM GOAL #3   Title Patient will be able to get in and out of the car with ease and without reporting difficulty lifting up her right leg.    Time 4    Period Weeks    Status On-going    Target Date 07/07/21               PT Long Term Goals - 06/16/21 1043       PT LONG TERM GOAL #1   Title Patient will report at least 50% improvement in overall symptoms and/or function to demonstrate improved functional mobility    Time 8    Period Weeks    Status On-going      PT LONG TERM GOAL #2   Title Patient will meet predicted FOTO score to demonstrate improved overall function.    Time 8    Period Weeks    Status On-going      PT LONG TERM GOAL #3   Title Patient will be able to walk around the grocery store for 20 minutes without reporting severe fatigue.    Time 8    Period Weeks    Status On-going                   Plan - 06/23/21 1103      Clinical Impression Statement Progressing with POC details and tolerating increased strengthening activities/exercises with emphasis on right hip/glute strength to improve single limb support and open chain for movements such as clearning bathtub during  transfers. Continued sessions indicated to progress proximal strength and dynamic balance to reduce risk for falls    Personal Factors and Comorbidities Comorbidity 1    Comorbidities B edema, chronic back pain    Examination-Activity Limitations Locomotion Level;Transfers;Lift;Stand;Stairs;Squat    Examination-Participation Restrictions Community Activity;Occupation;Shop;Cleaning;Church    Stability/Clinical Decision Making Stable/Uncomplicated    Rehab Potential Good    PT Frequency --   1-2x/week for total of 8 visits over 8 week certificaiton   PT Duration 8 weeks    PT Treatment/Interventions Cryotherapy;ADLs/Self Care Home Management;Neuromuscular re-education;Patient/family education;Therapeutic exercise;Therapeutic activities;Dry needling;Gait training;Balance training;Moist Heat;Joint Manipulations    PT Next Visit Plan continue to check for OT order; R LE strength, endurance and core.  F/U on compression garments.    PT Home Exercise Plan bridges, SLR, sidelying hip abd, prone heel squeeze, standing hip flex/abduction             Patient will benefit from skilled therapeutic intervention in order to improve the following deficits and impairments:  Pain, Decreased range of motion, Decreased balance, Difficulty walking, Decreased mobility, Decreased activity tolerance, Decreased strength, Decreased knowledge of use of DME, Postural dysfunction, Decreased endurance  Visit Diagnosis: Muscle weakness (generalized)  Difficulty in walking, not elsewhere classified  Right leg weakness     Problem List Patient Active Problem List   Diagnosis Date Noted   Microscopic hematuria 06/10/2021   Urge incontinence 06/10/2021    Abdominal wall hematoma 11/07/2012    Toniann Fail, PT 06/23/2021, 11:14 AM  [place], [place], [address] Phone: [phone]   Fax:  [phone]  Name: Meredith Weber MRN: [id number] Date of Birth: Nov 10, 1960

## 2021-06-23 NOTE — Patient Instructions (Signed)
Theraputty Home Exercise Program  Complete 1-2 times a day.  putty squeeze  Pt. should squeeze putty in hand trying to keep it round by rotating putty after each squeeze. push fingers through putty to palm each time. Complete for __3-5____ minutes.   PUTTY KEY GRIP  Hold the putty at the top of your hand. Squeeze the putty between your thumb and the side of your 2nd finger as shown. Complete for ____3-5____ minutes.    PUTTY 3 JAW CHUCK  Roll up some putty into a ball then flatten it. Then, firmly squeeze it with your first 3 fingers as shown. Complete for __3-5____ minutes.     Coordination Activities  Perform the following activities for 10-15 minutes 1-2 times per day with right hand(s).  Flip cards 1 at a time as fast as you can. Deal cards with your thumb (Hold deck in hand and push card off top with thumb). Rotate card in hand (clockwise and counter-clockwise). Shuffle cards. Pick up coins and place in container or coin bank. Pick up coins and stack. Pick up coins one at a time until you get 5-10 in your hand, then move coins from palm to fingertips to stack one at a time. Twirl pen between fingers. Practice writing and/or typing.    1) Strengthening: Chest Pull - Resisted   Hold Theraband in front of body with hands about shoulder width a part. Pull band a part and back together slowly. Repeat _10-12___ times. Complete __1__ set(s) per session.. Repeat _1-2___ session(s) per day.    3) Resisted External Rotation: in Neutral - Bilateral   Sit or stand, tubing in both hands, elbows at sides, bent to 90, forearms forward. Pinch shoulder blades together and rotate forearms out. Keep elbows at sides. Repeat __10-12__ times per set. Do __1__ sets per session. Do _1-2___ sessions per day.

## 2021-06-23 NOTE — Patient Instructions (Signed)
Access Code: [email or web address] URL: [email or web address] Date: 06/23/2021 Prepared by: Shary Decamp  Exercises Active Straight Leg Raise with Quad Set - 1 x daily - 7 x weekly - 3 sets - 10 reps Supine Bridge - 1 x daily - 7 x weekly - 3 sets - 10 reps Beginner Side Leg Lift - 1 x daily - 7 x weekly - 3 sets - 10 reps Prone Heel Squeeze - 1 x daily - 7 x weekly - 3 sets - 10 reps Standing March with Counter Support - 1 x daily - 7 x weekly - 3 sets - 10 reps

## 2021-06-24 ENCOUNTER — Ambulatory Visit (HOSPITAL_COMMUNITY)
Admission: RE | Admit: 2021-06-24 | Discharge: 2021-06-24 | Disposition: A | Discharge status: Home / Self Care | Payer: Self-pay | Source: Ambulatory Visit | Attending: Urology | Admitting: Urology

## 2021-06-24 DIAGNOSIS — R3129 Other microscopic hematuria: Secondary | ICD-10-CM | POA: Insufficient documentation

## 2021-06-24 LAB — POCT I-STAT CREATININE: Creatinine, Ser: 0.6 mg/dL (ref 0.44–1.00)

## 2021-06-24 MED ORDER — IOHEXOL 300 MG/ML  SOLN
100.0000 mL | Freq: Once | INTRAMUSCULAR | Status: AC | PRN
  Administered 2021-06-24: 100 mL via INTRAVENOUS

## 2021-06-26 ENCOUNTER — Encounter: Payer: Self-pay | Admitting: Urology

## 2021-06-28 NOTE — Telephone Encounter (Signed)
Patient asking for CT results.

## 2021-06-29 ENCOUNTER — Telehealth: Payer: Self-pay

## 2021-06-29 NOTE — Telephone Encounter (Signed)
Patient left a Voice Message:  Calling for recent test results.  Please advise.  Call back:  [phone]  Thanks, Rosey Bath

## 2021-06-29 NOTE — Telephone Encounter (Signed)
Patient advised you would reach out to her to discuss CT when available to call patient. Patient voiced understanding.

## 2021-06-30 ENCOUNTER — Ambulatory Visit (HOSPITAL_COMMUNITY): Payer: Self-pay

## 2021-06-30 ENCOUNTER — Other Ambulatory Visit: Payer: Self-pay

## 2021-06-30 ENCOUNTER — Encounter (HOSPITAL_COMMUNITY): Payer: Self-pay

## 2021-06-30 DIAGNOSIS — M6281 Muscle weakness (generalized): Secondary | ICD-10-CM

## 2021-06-30 DIAGNOSIS — R262 Difficulty in walking, not elsewhere classified: Secondary | ICD-10-CM

## 2021-06-30 DIAGNOSIS — R29898 Other symptoms and signs involving the musculoskeletal system: Secondary | ICD-10-CM

## 2021-06-30 NOTE — Therapy (Signed)
[place], [place], [address] Phone: [phone]   Fax:  [phone]  Physical Therapy Treatment  Patient Details  Name: Meredith Weber MRN: [id number] Date of Birth: 06-06-1961 Referring Provider (PT): Abran Richard MD   Encounter Date: 06/30/2021   PT End of Session - 06/30/21 1321     Visit Number 4    Number of Visits 8    Date for PT Re-Evaluation 08/04/21    Authorization Type self pay $25    PT Start Time [id number]    PT Stop Time 1359    PT Time Calculation (min) 46 min    Activity Tolerance Patient limited by fatigue    Behavior During Therapy [facility] for tasks assessed/performed             Past Medical History:  Diagnosis Date   Spinal stenosis     Past Surgical History:  Procedure Laterality Date   BREAST CYST EXCISION Left 2010   [place]  2010    There were no vitals filed for this visit.   Subjective Assessment - 06/30/21 1319     Subjective Pt stated main difficulty getting in/out of car and bathtub.    Pertinent History CVA    Patient Stated Goals to be able to walk around grocery store without being wore out and to get in and out of the shower with ease (bath/shower combo).    Currently in Pain? Yes    Pain Score 5     Pain Location Back    Pain Orientation Lower;Right    Pain Descriptors / Indicators Aching    Pain Type Chronic pain    Pain Onset More than a month ago    Pain Frequency Constant    Aggravating Factors  increased activity, maybe arthritis    Pain Relieving Factors nothing    Effect of Pain on Daily Activities severe effect                               [facility] Adult PT Treatment/Exercise - 06/30/21 0001       Exercises   Exercises Knee/Hip      Knee/Hip Exercises: Standing   Hip Flexion Stengthening;Both;2 sets;10 reps;Knee bent    Hip Flexion Limitations alternating toe tapping no HHA on 6in step     SLS 5x 3" max    Other Standing Knee Exercises lateral steppage over 6in hurdles with initially 2HHA progressing to no HHA 10x each      Knee/Hip Exercises: Seated   Marching Strengthening;Right;2 sets;10 reps    Marching Limitations lateral steppage over 6in hurdle, cueing not tolean back    Sit to [employer] 10 reps;without UE support      Knee/Hip Exercises: Supine   Bridges 2 sets;10 reps    Straight Leg Raises Both;10 reps    Straight Leg Raises Limitations eccentric lowering      Knee/Hip Exercises: Sidelying   Hip ABduction 10 reps;2 sets      Knee/Hip Exercises: Prone   Hip Extension 5 reps    Other Prone Exercises heelsqueeze 2X10 5" holds    Other Prone Exercises quadruped SAR 5x 3"; limited by Lt knee pain with SLR do stopped exercise                       PT Short Term Goals -  06/16/21 1042       PT SHORT TERM GOAL #1   Title Patient will be independent in self management strategies to improve quality of life and functional outcomes.    Time 3    Period Weeks    Status On-going    Target Date 07/07/21      PT SHORT TERM GOAL #2   Title Patient will report at least 25% improvement in overall symptoms and/or function to demonstrate improved functional mobility    Period Weeks    Status On-going    Target Date 07/07/21      PT SHORT TERM GOAL #3   Title Patient will be able to get in and out of the car with ease and without reporting difficulty lifting up her right leg.    Time 4    Period Weeks    Status On-going    Target Date 07/07/21               PT Long Term Goals - 06/16/21 1043       PT LONG TERM GOAL #1   Title Patient will report at least 50% improvement in overall symptoms and/or function to demonstrate improved functional mobility    Time 8    Period Weeks    Status On-going      PT LONG TERM GOAL #2   Title Patient will meet predicted FOTO score to demonstrate improved overall function.    Time 8    Period Weeks    Status  On-going      PT LONG TERM GOAL #3   Title Patient will be able to walk around the grocery store for 20 minutes without reporting severe fatigue.    Time 8    Period Weeks    Status On-going                   Plan - 06/30/21 1340     Clinical Impression Statement Progressing well toward POC.  Session focus with hip/gluteal strengthening to improve hip flexion and SLS to assist with clearing bathtub and doorway into car.  Pt limited by weakness with increased demand clearing 6in hurdles and was limited by fatigue with activiites.  No reports of pain through session.  Added SLS to HEP, encouraged to complete in front of counter/sink for safety.  Discussion held with benefits for compression hose, discussed hours and days to call [employer] to order.    Personal Factors and Comorbidities Comorbidity 1    Comorbidities B edema, chronic back pain    Examination-Activity Limitations Locomotion Level;Transfers;Lift;Stand;Stairs;Squat    Examination-Participation Restrictions Community Activity;Occupation;Shop;Cleaning;Church    Stability/Clinical Decision Making Stable/Uncomplicated    Clinical Decision Making Low    Rehab Potential Good    PT Frequency --   1-2x/week for total of 8 visits over 8 week certificaiton   PT Duration 8 weeks    PT Treatment/Interventions Cryotherapy;ADLs/Self Care Home Management;Neuromuscular re-education;Patient/family education;Therapeutic exercise;Therapeutic activities;Dry needling;Gait training;Balance training;Moist Heat;Joint Manipulations    PT Next Visit Plan R LE strength, endurance and core.  F/U on compression garments.    PT Home Exercise Plan bridges, SLR, sidelying hip abd, prone heel squeeze, standing hip flex/abduction; 12/13: SLS    Consulted and Agree with Plan of Care Patient             Patient will benefit from skilled therapeutic intervention in order to improve the following deficits and impairments:  Pain, Decreased range of motion,  Decreased balance, Difficulty walking,  Decreased mobility, Decreased activity tolerance, Decreased strength, Decreased knowledge of use of DME, Postural dysfunction, Decreased endurance  Visit Diagnosis: Muscle weakness (generalized)  Right leg weakness  Difficulty in walking, not elsewhere classified     Problem List Patient Active Problem List   Diagnosis Date Noted   Microscopic hematuria 06/10/2021   Urge incontinence 06/10/2021   Abdominal wall hematoma 11/07/2012   Becky Sax, LPTA/CLT; CBIS [phone]  Juel Burrow, PTA 06/30/2021, 2:11 PM  [facility] [address] [place], [place], [address] Phone: [phone]   Fax:  [phone]  Name: Meredith Weber MRN: [id number] Date of Birth: 08/19/1960

## 2021-07-07 ENCOUNTER — Ambulatory Visit (HOSPITAL_COMMUNITY): Payer: Self-pay

## 2021-07-07 ENCOUNTER — Other Ambulatory Visit: Payer: Self-pay

## 2021-07-07 ENCOUNTER — Ambulatory Visit (HOSPITAL_COMMUNITY): Payer: Self-pay | Admitting: Occupational Therapy

## 2021-07-07 DIAGNOSIS — M6281 Muscle weakness (generalized): Secondary | ICD-10-CM

## 2021-07-07 DIAGNOSIS — R29898 Other symptoms and signs involving the musculoskeletal system: Secondary | ICD-10-CM

## 2021-07-07 DIAGNOSIS — R262 Difficulty in walking, not elsewhere classified: Secondary | ICD-10-CM

## 2021-07-07 NOTE — Therapy (Signed)
[facility] [address] [place], [place], [address] Phone: [phone]   Fax:  [phone]  Physical Therapy Treatment  Patient Details  Name: Meredith Weber MRN: [id number] Date of Birth: [birth year] Referring Provider (PT): Alvina Filbert MD   Encounter Date: 07/07/2021   PT End of Session - 07/07/21 1031     Visit Number 5    Number of Visits 8    Date for PT Re-Evaluation 08/04/21    Authorization Type self pay $25    PT Start Time 1030    PT Stop Time 1115    PT Time Calculation (min) 45 min    Activity Tolerance Patient limited by fatigue    Behavior During Therapy [facility] for tasks assessed/performed             Past Medical History:  Diagnosis Date   Spinal stenosis     Past Surgical History:  Procedure Laterality Date   BREAST CYST EXCISION Left 2010   CESAREAN SECTION  1984   NOVASURE ABLATION  2010    There were no vitals filed for this visit.   Subjective Assessment - 07/07/21 1108     Subjective Continued difficulty getting in/out of bathtub and car. Right hip weakness and joint limitation causes difficulty with lifting over    Pertinent History CVA    Patient Stated Goals to be able to walk around grocery store without being wore out and to get in and out of the shower with ease (bath/shower combo).    Pain Onset More than a month ago                               [facility] Adult PT Treatment/Exercise - 07/07/21 0001       Transfers   Transfers Stand Pivot Transfers    Comments tub-transfers to improve safety with home mobility. Requires assist rail and supervision for safety. Improved trasnsfer using 4" step stool to improve balance.      Ambulation/Gait   Ambulation/Gait Yes    Ambulation/Gait Assistance 6: Modified independent (Device/Increase time)    Assistive device Straight cane    Gait Pattern Lateral trunk lean to right    Gait Comments training in use of cane to improve  patterna dn reduce RLE pain/fatigue      Knee/Hip Exercises: Standing   SLS 6" step lift-offs 10x3 sec      Knee/Hip Exercises: Supine   Other Supine Knee/Hip Exercises hip flexion drill over EOB placing LE on/off 6" step                     PT Education - 07/07/21 1109     Education Details education/demonstration on use of large step stool for in/out of tub to improve safety    Person(s) Educated Patient    Methods Explanation;Demonstration    Comprehension Returned demonstration              PT Short Term Goals - 06/16/21 1042       PT SHORT TERM GOAL #1   Title Patient will be independent in self management strategies to improve quality of life and functional outcomes.    Time 3    Period Weeks    Status On-going    Target Date 07/07/21      PT SHORT TERM GOAL #2   Title Patient will report at least 25% improvement in overall symptoms  and/or function to demonstrate improved functional mobility    Period Weeks    Status On-going    Target Date 07/07/21      PT SHORT TERM GOAL #3   Title Patient will be able to get in and out of the car with ease and without reporting difficulty lifting up her right leg.    Time 4    Period Weeks    Status On-going    Target Date 07/07/21               PT Long Term Goals - 06/16/21 1043       PT LONG TERM GOAL #1   Title Patient will report at least 50% improvement in overall symptoms and/or function to demonstrate improved functional mobility    Time 8    Period Weeks    Status On-going      PT LONG TERM GOAL #2   Title Patient will meet predicted FOTO score to demonstrate improved overall function.    Time 8    Period Weeks    Status On-going      PT LONG TERM GOAL #3   Title Patient will be able to walk around the grocery store for 20 minutes without reporting severe fatigue.    Time 8    Period Weeks    Status On-going                   Plan - 07/07/21 1109     Clinical Impression  Statement Demonstrating some capsular pattern right hip with report of groin pain with increased hip flexion.  Tolerating tx session well and demo improved independence and safety with tub transfers using 4-6" step stool, recommend use of aerobic/exercise step for larger platform and use of towel on top.  Difficulty with single limb stance and conitibnued sessions indicated to improve gait and balance to imrpove mobility and reduce risk for falls    Personal Factors and Comorbidities Comorbidity 1    Comorbidities B edema, chronic back pain    Examination-Activity Limitations Locomotion Level;Transfers;Lift;Stand;Stairs;Squat    Examination-Participation Restrictions Community Activity;Occupation;Shop;Cleaning;Church    Stability/Clinical Decision Making Stable/Uncomplicated    Rehab Potential Good    PT Frequency --   1-2x/week for total of 8 visits over 8 week certificaiton   PT Duration 8 weeks    PT Treatment/Interventions Cryotherapy;ADLs/Self Care Home Management;Neuromuscular re-education;Patient/family education;Therapeutic exercise;Therapeutic activities;Dry needling;Gait training;Balance training;Moist Heat;Joint Manipulations    PT Next Visit Plan R LE strength, endurance and core.  F/U on compression garments.  Progress balance and functional strengthening with additional step up, vector stance, increased height with hurdles to simulate getting in/out bathtub and car.    PT Home Exercise Plan bridges, SLR, sidelying hip abd, prone heel squeeze, standing hip flex/abduction; 12/13: SLS    Consulted and Agree with Plan of Care Patient             Patient will benefit from skilled therapeutic intervention in order to improve the following deficits and impairments:  Pain, Decreased range of motion, Decreased balance, Difficulty walking, Decreased mobility, Decreased activity tolerance, Decreased strength, Decreased knowledge of use of DME, Postural dysfunction, Decreased endurance  Visit  Diagnosis: Muscle weakness (generalized)  Right leg weakness  Difficulty in walking, not elsewhere classified     Problem List Patient Active Problem List   Diagnosis Date Noted   Microscopic hematuria 06/10/2021   Urge incontinence 06/10/2021   Abdominal wall hematoma 11/07/2012    Toniann Fail,  PT 07/07/2021, 11:11 AM  [place] [address] [place], [place], [address] Phone: [phone]   Fax:  [phone]  Name: RESHONDA DIOS MRN: [id number] Date of Birth: 10-Nov-1960

## 2021-07-07 NOTE — Patient Instructions (Signed)
Reclining on couch with step stool between your feet Lift and place one foot at a time on/off of stool Try for 3 rounds of 10 repetitions each leg  Standing by the kitchen sink or a wall (somewhere you can use your hands for balance) Place one foot on top of the stool then lift the foot a few inches above the step for 3 seconds and place it back down again. Try 10 in a row and switch legs.  Great exercise to improve your balance on one leg  Sidestepping: either by a wall/hall or kitchen counter Keep your knees and toes pointed straight, step to the left and right as far as you can go. Try for 2 minutes at a time and then rest. Aim for 2-3 rounds of this     *For the bathtub, remember we placed a 4-6 inch step stool on the outside of tub to make getting out easier *When using your cane: set the height to about 30 inches. Remember, use it in your left hand if your right leg is bothering you

## 2021-07-14 ENCOUNTER — Ambulatory Visit (HOSPITAL_COMMUNITY): Payer: Self-pay | Admitting: Physical Therapy

## 2021-07-21 ENCOUNTER — Encounter (HOSPITAL_COMMUNITY): Payer: Self-pay | Admitting: Occupational Therapy

## 2021-07-21 ENCOUNTER — Other Ambulatory Visit: Payer: Self-pay

## 2021-07-21 ENCOUNTER — Ambulatory Visit (HOSPITAL_COMMUNITY): Payer: Self-pay | Attending: Internal Medicine

## 2021-07-21 DIAGNOSIS — R29898 Other symptoms and signs involving the musculoskeletal system: Secondary | ICD-10-CM | POA: Insufficient documentation

## 2021-07-21 DIAGNOSIS — R262 Difficulty in walking, not elsewhere classified: Secondary | ICD-10-CM | POA: Insufficient documentation

## 2021-07-21 DIAGNOSIS — M6281 Muscle weakness (generalized): Secondary | ICD-10-CM | POA: Insufficient documentation

## 2021-07-21 NOTE — Therapy (Signed)
Meredith Weber, [place], [address] Phone: [phone]   Fax:  [phone]  Physical Therapy Treatment  Patient Details  Name: Meredith Weber MRN: [id number] Date of Birth: 1961/01/13 Referring Provider (PT): Abran Richard MD   Encounter Date: 07/21/2021   PT End of Session - 07/21/21 1024     Visit Number 6    Number of Visits 8    Date for PT Re-Evaluation 08/04/21    Authorization Type self pay $25    PT Start Time 1030    PT Stop Time 1115    PT Time Calculation (min) 45 min    Activity Tolerance Patient limited by fatigue    Behavior During Therapy [facility] for tasks assessed/performed             Past Medical History:  Diagnosis Date   Spinal stenosis     Past Surgical History:  Procedure Laterality Date   BREAST CYST EXCISION Left 2010   [place]  2010    There were no vitals filed for this visit.   Subjective Assessment - 07/21/21 1030     Subjective Obtained a step stool for use in bathroom/bathtub transfers and notes easier time. Right groin pain when getting in/out of car and bending over    Pertinent History CVA    Patient Stated Goals to be able to walk around grocery store without being wore out and to get in and out of the shower with ease (bath/shower combo).    Currently in Pain? Yes    Pain Score 4     Pain Location Groin    Pain Orientation Right    Pain Descriptors / Indicators Aching    Pain Type Chronic pain    Pain Onset More than a month ago                [facility] PT Assessment - 07/21/21 0001       Assessment   Medical Diagnosis Right side weakness    Referring Provider (PT) Abran Richard MD    Onset Date/Surgical Date 04/08/21    Hand Dominance Right    Next MD Visit 06/29/21                           [facility] Adult PT Treatment/Exercise - 07/21/21 0001       Knee/Hip Exercises: Standing   Knee Flexion  Strengthening;Right;1 set;10 reps    Knee Flexion Limitations green t-loop    Hip Flexion Stengthening;Right;1 set;10 reps    Hip Flexion Limitations green t-loop    SLS 4 sec right/left    Other Standing Knee Exercises lateral steps with green t-loop x 2 min    Other Standing Knee Exercises right hip AROM/traction/swinging LE 2 x 60 sec                       PT Short Term Goals - 07/21/21 1035       PT SHORT TERM GOAL #1   Title Patient will be independent in self management strategies to improve quality of life and functional outcomes.    Time 3    Period Weeks    Status On-going    Target Date 07/07/21      PT SHORT TERM GOAL #2   Title Patient will report at least 25% improvement in overall symptoms and/or function to demonstrate  improved functional mobility    Baseline 20% improvement    Period Weeks    Status On-going    Target Date 07/07/21      PT SHORT TERM GOAL #3   Title Patient will be able to get in and out of the car with ease and without reporting difficulty lifting up her right leg.    Baseline varying with right groin discomfort    Time 4    Period Weeks    Status On-going    Target Date 07/07/21               PT Long Term Goals - 07/21/21 1044       PT LONG TERM GOAL #1   Title Patient will report at least 50% improvement in overall symptoms and/or function to demonstrate improved functional mobility    Time 8    Period Weeks    Status On-going      PT LONG TERM GOAL #2   Title Patient will meet predicted FOTO score to demonstrate improved overall function.    Time 8    Period Weeks    Status On-going      PT LONG TERM GOAL #3   Title Patient will be able to walk around the grocery store for 20 minutes without reporting severe fatigue.    Baseline able to perform shopping trips with use of cane or shopping cart for support    Time 8    Period Weeks    Status Achieved                   Plan - 07/21/21 1105      Clinical Impression Statement Progressing with overall mobility and PRE for LE strength.  Right hip demo capsular pattern and pain typical of intra-articular origin with +FADIR RLE and limitations in forward flexion as in donning socks/shoes.  Pt notes improved activity tolerance overall with ability to ambulate longer distances/times with use of external support. Continued sessions to refine HEP and prepare for D/C to HEP. Will trial hip traction RLE to see if this helps alleivate some symptoms    Personal Factors and Comorbidities Comorbidity 1    Comorbidities B edema, chronic back pain    Examination-Activity Limitations Locomotion Level;Transfers;Lift;Stand;Stairs;Squat    Examination-Participation Restrictions Community Activity;Occupation;Shop;Cleaning;Church    Stability/Clinical Decision Making Stable/Uncomplicated    Rehab Potential Good    PT Frequency --   1-2x/week for total of 8 visits over 8 week certificaiton   PT Duration 8 weeks    PT Treatment/Interventions Cryotherapy;ADLs/Self Care Home Management;Neuromuscular re-education;Patient/family education;Therapeutic exercise;Therapeutic activities;Dry needling;Gait training;Balance training;Moist Heat;Joint Manipulations    PT Next Visit Plan Trial hip traction RLE    PT Home Exercise Plan bridges, SLR, sidelying hip abd, prone heel squeeze, standing hip flex/abduction; sidestepping with resistance loop, hamstring curls, hip flexion with resistance loop. Butterfly stretch demo'ed    Consulted and Agree with Plan of Care Patient             Patient will benefit from skilled therapeutic intervention in order to improve the following deficits and impairments:  Pain, Decreased range of motion, Decreased balance, Difficulty walking, Decreased mobility, Decreased activity tolerance, Decreased strength, Decreased knowledge of use of DME, Postural dysfunction, Decreased endurance  Visit Diagnosis: Muscle weakness (generalized)  Right  leg weakness  Difficulty in walking, not elsewhere classified     Problem List Patient Active Problem List   Diagnosis Date Noted   Microscopic hematuria 06/10/2021  Urge incontinence 06/10/2021   Abdominal wall hematoma 11/07/2012    Toniann Fail, PT 07/21/2021, 11:09 AM  [place], [place], [address] Phone: [phone]   Fax:  [phone]  Name: Meredith Weber MRN: [id number] Date of Birth: October 21, 1960

## 2021-07-27 ENCOUNTER — Other Ambulatory Visit: Payer: Self-pay | Admitting: Urology

## 2021-07-28 ENCOUNTER — Ambulatory Visit (HOSPITAL_COMMUNITY): Payer: Self-pay | Admitting: Physical Therapy

## 2021-07-28 ENCOUNTER — Other Ambulatory Visit: Payer: Self-pay

## 2021-07-28 ENCOUNTER — Encounter (HOSPITAL_COMMUNITY): Payer: Self-pay | Admitting: Occupational Therapy

## 2021-07-28 DIAGNOSIS — R29898 Other symptoms and signs involving the musculoskeletal system: Secondary | ICD-10-CM

## 2021-07-28 DIAGNOSIS — M6281 Muscle weakness (generalized): Secondary | ICD-10-CM

## 2021-07-28 DIAGNOSIS — R262 Difficulty in walking, not elsewhere classified: Secondary | ICD-10-CM

## 2021-07-28 NOTE — Therapy (Signed)
[place], [place], [address] Phone: [phone]   Fax:  [phone]  Physical Therapy Treatment  Patient Details  Name: Meredith Weber MRN: [id number] Date of Birth: 1960-08-12 Referring Provider (PT): Abran Richard MD   Encounter Date: 07/28/2021   PT End of Session - 07/28/21 1132     Visit Number 7    Number of Visits 8    Date for PT Re-Evaluation 08/04/21    Authorization Type self pay $25    PT Start Time 1048    PT Stop Time 1130    PT Time Calculation (min) 42 min    Activity Tolerance Patient limited by fatigue    Behavior During Therapy [facility] for tasks assessed/performed             Past Medical History:  Diagnosis Date   Spinal stenosis     Past Surgical History:  Procedure Laterality Date   BREAST CYST EXCISION Left 2010   [place]  2010    There were no vitals filed for this visit.   Subjective Assessment - 07/28/21 1056     Subjective Pt states getting in and out of bath is much easier since she got a step but getting in her car is still challenging (out not as much).  Currently 4/10 pain in Rt hip and into groin.    Currently in Pain? Yes    Pain Score 4     Pain Location Hip    Pain Orientation Right    Pain Descriptors / Indicators Aching;Sore    Pain Type Chronic pain                               [facility] Adult PT Treatment/Exercise - 07/28/21 0001       Knee/Hip Exercises: Standing   Hip Flexion Both;10 reps    Forward Lunges Both;10 reps;2 sets    Forward Lunges Limitations no UE's onto 4" step    Hip Abduction Both;10 reps    Hip Extension Both;10 reps    Forward Step Up Both;10 reps;Hand Hold: 1;Step Height: 6"    Forward Step Up Limitations controlled descent    Other Standing Knee Exercises 6" hurdles (4) 5Rt lateral and forward      Knee/Hip Exercises: Seated   Sit to Sand 10 reps;without UE support                        PT Short Term Goals - 07/21/21 1035       PT SHORT TERM GOAL #1   Title Patient will be independent in self management strategies to improve quality of life and functional outcomes.    Time 3    Period Weeks    Status On-going    Target Date 07/07/21      PT SHORT TERM GOAL #2   Title Patient will report at least 25% improvement in overall symptoms and/or function to demonstrate improved functional mobility    Baseline 20% improvement    Period Weeks    Status On-going    Target Date 07/07/21      PT SHORT TERM GOAL #3   Title Patient will be able to get in and out of the car with ease and without reporting difficulty lifting up her right leg.    Baseline varying with right groin discomfort  Time 4    Period Weeks    Status On-going    Target Date 07/07/21               PT Long Term Goals - 07/21/21 1044       PT LONG TERM GOAL #1   Title Patient will report at least 50% improvement in overall symptoms and/or function to demonstrate improved functional mobility    Time 8    Period Weeks    Status On-going      PT LONG TERM GOAL #2   Title Patient will meet predicted FOTO score to demonstrate improved overall function.    Time 8    Period Weeks    Status On-going      PT LONG TERM GOAL #3   Title Patient will be able to walk around the grocery store for 20 minutes without reporting severe fatigue.    Baseline able to perform shopping trips with use of cane or shopping cart for support    Time 8    Period Weeks    Status Achieved                   Plan - 07/28/21 1130     Clinical Impression Statement Pt with continued hip pain and some discomfort into bil knees.  Continued with work on bil hip and LE strengthening.  Added forward lunges using 4 step with min use of UE for work on stabilizers.  Pt able to complete these with cues for form but without discomfort. Pt unable to complete squat in correct form with weight  pushing into toes and forward flexion of body completing activity.  Worked on sit to stands to correct this with much improved form.   Began lateral and forward step overs with 6 hurdles    Personal Factors and Comorbidities Comorbidity 1    Comorbidities B edema, chronic back pain    Examination-Activity Limitations Locomotion Level;Transfers;Lift;Stand;Stairs;Squat    Examination-Participation Restrictions Community Activity;Occupation;Shop;Cleaning;Church    Stability/Clinical Decision Making Stable/Uncomplicated    Rehab Potential Good    PT Frequency --   1-2x/week for total of 8 visits over 8 week certificaiton   PT Duration 8 weeks    PT Treatment/Interventions Cryotherapy;ADLs/Self Care Home Management;Neuromuscular re-education;Patient/family education;Therapeutic exercise;Therapeutic activities;Dry needling;Gait training;Balance training;Moist Heat;Joint Manipulations    PT Next Visit Plan continue to progress LE strength; possible trial of Rt hip traction.    PT Home Exercise Plan bridges, SLR, sidelying hip abd, prone heel squeeze, standing hip flex/abduction; sidestepping with resistance loop, hamstring curls, hip flexion with resistance loop. Butterfly stretch demo'ed    Consulted and Agree with Plan of Care Patient             Patient will benefit from skilled therapeutic intervention in order to improve the following deficits and impairments:  Pain, Decreased range of motion, Decreased balance, Difficulty walking, Decreased mobility, Decreased activity tolerance, Decreased strength, Decreased knowledge of use of DME, Postural dysfunction, Decreased endurance  Visit Diagnosis: Muscle weakness (generalized)  Right leg weakness  Difficulty in walking, not elsewhere classified     Problem List Patient Active Problem List   Diagnosis Date Noted   Microscopic hematuria 06/10/2021   Urge incontinence 06/10/2021   Abdominal wall hematoma 11/07/2012   Meredith Weber,  PTA/CLT, WTA [phone]  Meredith Weber, PTA 07/28/2021, 11:36 AM  [place], [place], [address] Phone: [phone]   Fax:  [phone]  Name: Meredith Weber MRN: [id number] Date of Birth: [birth year]

## 2021-08-04 ENCOUNTER — Ambulatory Visit (HOSPITAL_COMMUNITY): Payer: Self-pay | Admitting: Physical Therapy

## 2021-08-04 ENCOUNTER — Encounter (HOSPITAL_COMMUNITY): Payer: Self-pay

## 2021-08-05 ENCOUNTER — Other Ambulatory Visit: Payer: Self-pay

## 2021-08-05 ENCOUNTER — Ambulatory Visit (HOSPITAL_COMMUNITY): Payer: Self-pay

## 2021-08-05 ENCOUNTER — Encounter (HOSPITAL_COMMUNITY): Payer: Self-pay

## 2021-08-05 DIAGNOSIS — R29898 Other symptoms and signs involving the musculoskeletal system: Secondary | ICD-10-CM

## 2021-08-05 DIAGNOSIS — M6281 Muscle weakness (generalized): Secondary | ICD-10-CM

## 2021-08-05 DIAGNOSIS — R262 Difficulty in walking, not elsewhere classified: Secondary | ICD-10-CM

## 2021-08-05 NOTE — Therapy (Signed)
[name], [place], [address] Phone: [phone]   Fax:  [phone]  PHYSICAL THERAPY DISCHARGE SUMMARY  Visits from Start of Care: 8  Current functional level related to goals / functional outcomes: 2/3 STG and 1/3 LTG met, progressing to meet unmet goals   Remaining deficits: See below   Education / Equipment: HEP, walking program   Patient agrees to discharge. Patient goals were partially met. Patient is being discharged due to being pleased with the current functional level.   Physical Therapy Treatment  Patient Details  Name: Meredith Weber MRN: [id number] Date of Birth: [birth year] Referring Provider (PT): Abran Richard MD   Encounter Date: 08/05/2021   PT End of Session - 08/05/21 0955     Visit Number 8    Number of Visits 8    Date for PT Re-Evaluation 97/98/92   cert to cover today d/c only   Authorization Type self pay $25    PT Start Time 0915    PT Stop Time 0955    PT Time Calculation (min) 40 min    Activity Tolerance Patient tolerated treatment well    Behavior During Therapy [facility] for tasks assessed/performed             Past Medical History:  Diagnosis Date   Spinal stenosis     Past Surgical History:  Procedure Laterality Date   BREAST CYST EXCISION Left 2010   [place]  2010    There were no vitals filed for this visit.   Subjective Assessment - 08/05/21 0917     Subjective Pt reports difficulty getting into/out of driver side of car, "staggering" with balance due to RLE, difficulty with steps due to RLE and back pain. Pt reports 40% improvement since starting therapy. Pt reports fluid pill has helped with BLE swelling which is helping her balance.    Currently in Pain? Yes    Pain Score 5     Pain Location Back    Pain Orientation Right    Pain Descriptors / Indicators Aching   "normal"   Pain Type Chronic pain                 [facility] PT Assessment - 08/05/21 0929       Assessment   Medical Diagnosis Right side weakness    Referring Provider (PT) Abran Richard MD    Onset Date/Surgical Date 04/08/21    Hand Dominance Right    Next MD Visit 06/29/21      Cognition   Overall Cognitive Status Within Functional Limits for tasks assessed      Observation/Other Assessments   Observations no pitting edema in extremities    Focus on Therapeutic Outcomes (FOTO)  46%   was 40%     Strength   Right Hip Flexion 4-/5   was 3+   Right Hip Extension 2+/5   was 2+ limited ROM   Left Hip Flexion 4/5   was 4   Left Hip Extension 2+/5   was 2+ limited ROM   Right Knee Flexion 4/5   was 3+   Right Knee Extension 4/5   was 3+   Left Knee Flexion 4/5   was 3+   Left Knee Extension 4/5   was 3+   Right Ankle Dorsiflexion 4-/5   was 3+   Left Ankle Dorsiflexion 4-/5   was 4-     Bed Mobility  Bed Mobility --   mod I with bed mobility, increased time with VC for exhale to improve mobility and reduce back pain     Transfers   Comments simulated car transfers, education on sitting bottom first then rotating trunk and swing BLE into/out of car, avoid SLS; VC as needed for sequencing      Ambulation/Gait   Ambulation/Gait Yes    Ambulation/Gait Assistance 6: Modified independent (Device/Increase time)    Ambulation Distance (Feet) 360 Feet   was 276   Assistive device None    Gait Pattern Lateral trunk lean to right;Trendelenburg    Gait Comments 2MWT                   PT Education - 08/05/21 1000     Education Details Educated pt on walking program, HEP, progressing HEP and walking program as able, strategies to enter/exit vehicle.    Person(s) Educated Patient    Methods Explanation    Comprehension Verbalized understanding              PT Short Term Goals - 08/05/21 0921       PT SHORT TERM GOAL #1   Title Patient will be independent in self management strategies to improve quality of  life and functional outcomes.    Baseline 1/18: yes, but still learning limits    Time 3    Period Weeks    Status Achieved    Target Date 07/07/21      PT SHORT TERM GOAL #2   Title Patient will report at least 25% improvement in overall symptoms and/or function to demonstrate improved functional mobility    Baseline 20% improvement; 1/18: 40% improvement    Period Weeks    Status Achieved    Target Date 07/07/21      PT SHORT TERM GOAL #3   Title Patient will be able to get in and out of the car with ease and without reporting difficulty lifting up her right leg.    Baseline varying with right groin discomfort; 1/18: still difficult to lift RLE and takes increased time    Time 4    Period Weeks    Status On-going    Target Date 07/07/21               PT Long Term Goals - 08/05/21 0923       PT LONG TERM GOAL #1   Title Patient will report at least 50% improvement in overall symptoms and/or function to demonstrate improved functional mobility    Baseline 1/18: 40% improvement    Time 8    Period Weeks    Status On-going      PT LONG TERM GOAL #2   Title Patient will meet predicted FOTO score to demonstrate improved overall function.    Baseline 1/18: 46%, predicted is 57%    Time 8    Period Weeks    Status On-going      PT LONG TERM GOAL #3   Title Patient will be able to walk around the grocery store for 20 minutes without reporting severe fatigue.    Baseline able to perform shopping trips with use of cane or shopping cart for support; 1/18: 30 minutes, moderately fatigued    Time 8    Period Weeks    Status Achieved                   Plan - 08/05/21 5217  Clinical Impression Statement Pt presents today for reassessment. Pt has improved R knee strength, 2MWT and FOTO score by 6%. Subjectively, pt reports overall improvement of 40% pertaining to RLE, also reports R sided LBP that limits daily function. Pt has met 2/3 STG and 1/3 LTG. Pt well  educated on HEP, reports daily compliance, educated on walking program and how to progress as able. Pt motivated to continue progressing at home, able to verbalize and demonstrate compensation strategies for enterring/exiting vehicle and verbalize ability to ask family for assist with household chores as needed. Pt in agreement to d/c from therpay this date and continue progressing towards goals independently.    Personal Factors and Comorbidities Comorbidity 1    Comorbidities B edema, chronic back pain    Examination-Activity Limitations Locomotion Level;Transfers;Lift;Stand;Stairs;Squat    Examination-Participation Restrictions Community Activity;Occupation;Shop;Cleaning;Church    Stability/Clinical Decision Making Stable/Uncomplicated    Rehab Potential Good    PT Frequency --   1-2x/week for total of 8 visits over 8 week certificaiton   PT Duration 8 weeks    PT Treatment/Interventions Cryotherapy;ADLs/Self Care Home Management;Neuromuscular re-education;Patient/family education;Therapeutic exercise;Therapeutic activities;Dry needling;Gait training;Balance training;Moist Heat;Joint Manipulations    PT Next Visit Plan d/c to HEP and walking program    PT Home Exercise Plan bridges, SLR, sidelying hip abd, prone heel squeeze, standing hip flex/abduction; sidestepping with resistance loop, hamstring curls, hip flexion with resistance loop. Butterfly stretch demo'ed    Consulted and Agree with Plan of Care Patient             Patient will benefit from skilled therapeutic intervention in order to improve the following deficits and impairments:  Pain, Decreased range of motion, Decreased balance, Difficulty walking, Decreased mobility, Decreased activity tolerance, Decreased strength, Decreased knowledge of use of DME, Postural dysfunction, Decreased endurance  Visit Diagnosis: Muscle weakness (generalized)  Right leg weakness  Difficulty in walking, not elsewhere  classified     Problem List Patient Active Problem List   Diagnosis Date Noted   Microscopic hematuria 06/10/2021   Urge incontinence 06/10/2021   Abdominal wall hematoma 11/07/2012    Talbot Grumbling PT, DPT 08/05/21, 12:34 PM    [place], [place], [address] Phone: [phone]   Fax:  [phone]  Name: ASTHA PROBASCO MRN: [id number] Date of Birth: 1960/08/01

## 2021-08-11 ENCOUNTER — Other Ambulatory Visit: Payer: Self-pay | Admitting: Urology

## 2021-09-07 ENCOUNTER — Other Ambulatory Visit: Payer: Self-pay | Admitting: Urology

## 2021-09-07 NOTE — Progress Notes (Unsigned)
And   Assessment: 1. Microscopic hematuria   2. Urge incontinence       Plan: I reviewed outside records from [facility]. Today I had a discussion with the patient regarding the findings of microscopic hematuria including the implications and differential diagnoses associated with it.  I also discussed recommendations for further evaluation including the rationale for upper tract imaging and cystoscopy.  I discussed the nature of these procedures including potential risk and complications.  The patient expressed an understanding of these issues. Recommend further evaluation with CT hematuria protocol and cystoscopy Request recent labs from [facility] Her urge incontinence is not bothersome for her and she is not interested in any treatment at this time.  Chief Complaint:  No chief complaint on file.   History of Present Illness:  Meredith Weber is a 61 y.o. year old female who is seen for further evaluation of microscopic hematuria.  Urinalysis results: 09/24/2020: 2 RBCs 04/08/2021: 5 RBCs 05/25/2021: Small blood on dipstick 11/22:  3-10 RBCs No history of gross hematuria.  She reports possibly noting some traces of blood on her toilet paper in September 2022.  She does have a remote history of kidney stones as a teenager.  No recent stone symptoms.  No recent UTIs.  She does have some chronic low back pain.  No dysuria.  She does have occasional urge incontinence but does not use any pads.  She is optically bothered by this. No history of tobacco use.  CT hematuria protocol from 06/25/2021 showed no renal or ureteral calculi, bilateral renal cyst, small hypodense lesion left upper pole, mild bilateral renal atrophy. She presents today for cystoscopy.  Portions of the above documentation were copied from a prior visit for review purposes only.   Past Medical History:  Past Medical History:  Diagnosis Date   Spinal stenosis     Past  Surgical History:  Past Surgical History:  Procedure Laterality Date   BREAST CYST EXCISION Left 2010   CESAREAN SECTION  1984   [place] ABLATION  2010    Allergies:  No Known Allergies  Family History:  No family history on file.  Social History:  Social History   Tobacco Use   Smoking status: Former    Packs/day: 1.00    Types: Cigarettes   Smokeless tobacco: Current  Substance Use Topics   Alcohol use: Yes   Drug use: No    ROS: Constitutional:  Negative for fever, chills, weight loss CV: Negative for chest pain, previous MI, hypertension Respiratory:  Negative for shortness of breath, wheezing, sleep apnea, frequent cough GI:  Negative for nausea, vomiting, bloody stool, GERD  Physical exam: There were no vitals taken for this visit. GENERAL APPEARANCE:  Well appearing, well developed, well nourished, NAD HEENT:  Atraumatic, normocephalic, oropharynx clear NECK:  Supple without lymphadenopathy or thyromegaly ABDOMEN:  Soft, non-tender, no masses EXTREMITIES:  Moves all extremities well, without clubbing, cyanosis, or edema NEUROLOGIC:  Alert and oriented x 3, normal gait, CN II-XII grossly intact MENTAL STATUS:  appropriate BACK:  Non-tender to palpation, No CVAT SKIN:  Warm, dry, and intact  Results: U/A:   Procedure:  Flexible Cystourethroscopy  Pre-operative Diagnosis: {cysto diagnosis:26394}  Post-operative Diagnosis: {cysto diagnosis:26394}  Anesthesia:  local with lidocaine jelly  Surgical Narrative:  After appropriate informed consent was obtained, the patient was prepped and draped in the usual sterile fashion in the supine position.  The patient was correctly identified and the proper procedure  delineated prior to proceeding.  Sterile lidocaine gel was instilled in the urethra. The flexible cystoscope was introduced without difficulty.  Findings:  Urethra: {anterior urethral findings:26395}  Bladder: {bladder findings:26397}  Ureteral  orifices: {Normal/Abnormal Appearance:21344::"normal"}  Additional findings:  Saline bladder wash for cytology {WAS/WAS NOT:[phone]::"was not"} performed.    The cystoscope was then removed.  The patient tolerated the procedure well.

## 2021-10-06 ENCOUNTER — Ambulatory Visit (INDEPENDENT_AMBULATORY_CARE_PROVIDER_SITE_OTHER): Payer: Self-pay | Admitting: Urology

## 2021-10-06 ENCOUNTER — Other Ambulatory Visit: Payer: Self-pay

## 2021-10-06 ENCOUNTER — Encounter: Payer: Self-pay | Admitting: Urology

## 2021-10-06 VITALS — BP 106/71 | HR 108 | Ht <= 58 in | Wt 196.0 lb

## 2021-10-06 DIAGNOSIS — R3129 Other microscopic hematuria: Secondary | ICD-10-CM

## 2021-10-06 DIAGNOSIS — N3941 Urge incontinence: Secondary | ICD-10-CM

## 2021-10-06 LAB — URINALYSIS, ROUTINE W REFLEX MICROSCOPIC
Bilirubin, UA: NEGATIVE
Glucose, UA: NEGATIVE
Nitrite, UA: NEGATIVE
Specific Gravity, UA: >1.03 — ABNORMAL HIGH (ref 1.005–1.030)
Urobilinogen, Ur: 0.2 mg/dL (ref 0.2–1.0)
pH, UA: 5.5 (ref 5.0–7.5)

## 2021-10-06 LAB — MICROSCOPIC EXAMINATION: Renal Epithel, UA: NONE SEEN /hpf

## 2021-10-06 MED ORDER — CIPROFLOXACIN HCL 500 MG PO TABS
500.0000 mg | ORAL_TABLET | Freq: Once | ORAL | Status: AC
  Administered 2021-10-06: 500 mg via ORAL

## 2021-10-06 NOTE — Progress Notes (Signed)
And  ? ?Assessment: ?1. Microscopic hematuria   ?2. Urge incontinence   ? ? ?Plan: ?I personally reviewed the CT study from 06/25/2021 with results as noted below. ?No serious or life-threatening explanation for her microscopic hematuria identified. ?Recommend periodic urinalysis to follow microscopic hematuria. ?Her urge incontinence is not bothersome for her and she is not interested in any treatment at this time. ?Return to office in 6 months. ? ?Chief Complaint:  ?Chief Complaint  ?Patient presents with  ? Hematuria  ? ? ?History of Present Illness: ? ?Meredith Weber is a 61 y.o. year old female who is seen for further evaluation of microscopic hematuria.  ?Urinalysis results: ?09/24/2020: 2 RBCs ?04/08/2021: 5 RBCs ?05/25/2021: Small blood on dipstick ?11/22:  3-10 RBCs ?No history of gross hematuria.  She reports possibly noting some traces of blood on her toilet paper in September 2022.  She does have a remote history of kidney stones as a teenager.  No recent stone symptoms.  No recent UTIs.  She does have some chronic low back pain.  No dysuria.  She does have occasional urge incontinence but does not use any pads.  Her incontinence is not bothersome for her. ?No history of tobacco use. ? ?CT hematuria protocol from 06/25/2021 showed no renal or ureteral calculi, bilateral renal cyst, small hypodense lesion left upper pole, mild bilateral renal atrophy. ?She presents today for cystoscopy. ?No dysuria or gross hematuria.  She does have intermittent low back pain.  No flank pain. ? ?Portions of the above documentation were copied from a prior visit for review purposes only. ? ? ?Past Medical History:  ?Past Medical History:  ?Diagnosis Date  ? Spinal stenosis   ? ? ?Past Surgical History:  ?Past Surgical History:  ?Procedure Laterality Date  ? BREAST CYST EXCISION Left 2010  ? CESAREAN SECTION  1984  ? [place] ABLATION  2010  ? ? ?Allergies:  ?Allergies  ?Allergen Reactions  ? Cashew Nut Oil Anaphylaxis and Rash   ? ? ?Family History:  ?No family history on file. ? ?Social History:  ?Social History  ? ?Tobacco Use  ? Smoking status: Former  ?  Packs/day: 1.00  ?  Types: Cigarettes  ? Smokeless tobacco: Current  ?Substance Use Topics  ? Alcohol use: Yes  ? Drug use: No  ? ? ?ROS: ?Constitutional:  Negative for fever, chills, weight loss ?CV: Negative for chest pain, previous MI, hypertension ?Respiratory:  Negative for shortness of breath, wheezing, sleep apnea, frequent cough ?GI:  Negative for nausea, vomiting, bloody stool, GERD ? ?Physical exam: ?BP 106/71   Pulse (!) 108   Ht 4\' 10"  (1.473 m)   Wt 196 lb (88.9 kg)   BMI 40.96 kg/m?  ?GENERAL APPEARANCE:  Well appearing, well developed, well nourished, NAD ?HEENT:  Atraumatic, normocephalic, oropharynx clear ?NECK:  Supple without lymphadenopathy or thyromegaly ?ABDOMEN:  Soft, non-tender, no masses ?EXTREMITIES:  Moves all extremities well, without clubbing, cyanosis, or edema ?NEUROLOGIC:  Alert and oriented x 3, normal gait, CN II-XII grossly intact ?MENTAL STATUS:  appropriate ?BACK:  Non-tender to palpation, No CVAT ?SKIN:  Warm, dry, and intact ? ?Results: ?U/A: 6-10 WBC, 0-2 RBC, mod bacteria ? ?Procedure:  Flexible Cystourethroscopy ? ?Pre-operative Diagnosis: Microscopic hematuria ? ?Post-operative Diagnosis: Microscopic hematuria ? ?Anesthesia:  local with lidocaine jelly ? ?Surgical Narrative: ? ?After appropriate informed consent was obtained, the patient was prepped and draped in the usual sterile fashion in the supine position.  The patient was correctly identified  and the proper procedure delineated prior to proceeding.  Sterile lidocaine gel was instilled in the urethra. ?The flexible cystoscope was introduced without difficulty. ? ?Findings: ? ?Urethra: Normal ? ?Bladder: Normal ? ?Ureteral orifices: normal ? ?Additional findings: none ? ?Saline bladder wash for cytology was not performed.   ? ?The cystoscope was then removed.  The patient tolerated  the procedure well. ? ? ? ?

## 2022-03-29 ENCOUNTER — Other Ambulatory Visit: Payer: Self-pay

## 2022-03-29 DIAGNOSIS — Z8673 Personal history of transient ischemic attack (TIA), and cerebral infarction without residual deficits: Secondary | ICD-10-CM

## 2022-03-29 DIAGNOSIS — M545 Low back pain, unspecified: Secondary | ICD-10-CM

## 2022-04-09 ENCOUNTER — Ambulatory Visit
Admission: RE | Admit: 2022-04-09 | Discharge: 2022-04-09 | Disposition: A | Discharge status: Home / Self Care | Payer: Disability Insurance | Source: Ambulatory Visit

## 2022-04-09 ENCOUNTER — Ambulatory Visit
Admission: RE | Admit: 2022-04-09 | Discharge: 2022-04-09 | Disposition: A | Discharge status: Home / Self Care | Payer: Disability Insurance | Source: Ambulatory Visit | Attending: Pediatric Emergency Medicine | Admitting: Pediatric Emergency Medicine

## 2022-04-09 DIAGNOSIS — M545 Low back pain, unspecified: Secondary | ICD-10-CM

## 2022-04-09 DIAGNOSIS — M25562 Pain in left knee: Secondary | ICD-10-CM | POA: Diagnosis not present

## 2022-04-09 DIAGNOSIS — Z8673 Personal history of transient ischemic attack (TIA), and cerebral infarction without residual deficits: Secondary | ICD-10-CM

## 2022-04-09 DIAGNOSIS — M5136 Other intervertebral disc degeneration, lumbar region: Secondary | ICD-10-CM | POA: Diagnosis not present

## 2022-04-09 DIAGNOSIS — M25461 Effusion, right knee: Secondary | ICD-10-CM | POA: Insufficient documentation

## 2022-04-09 DIAGNOSIS — M170 Bilateral primary osteoarthritis of knee: Secondary | ICD-10-CM | POA: Diagnosis not present

## 2022-04-09 DIAGNOSIS — M47819 Spondylosis without myelopathy or radiculopathy, site unspecified: Secondary | ICD-10-CM | POA: Diagnosis not present

## 2022-04-09 DIAGNOSIS — M25561 Pain in right knee: Secondary | ICD-10-CM | POA: Insufficient documentation

## 2022-04-14 ENCOUNTER — Encounter: Payer: Self-pay | Admitting: Urology

## 2022-04-14 ENCOUNTER — Ambulatory Visit: Payer: Self-pay | Admitting: Urology

## 2022-04-14 ENCOUNTER — Ambulatory Visit (INDEPENDENT_AMBULATORY_CARE_PROVIDER_SITE_OTHER): Payer: Disability Insurance | Admitting: Urology

## 2022-04-14 VITALS — BP 117/77 | HR 85 | Ht 59.0 in | Wt 190.0 lb

## 2022-04-14 DIAGNOSIS — N3941 Urge incontinence: Secondary | ICD-10-CM

## 2022-04-14 DIAGNOSIS — R3129 Other microscopic hematuria: Secondary | ICD-10-CM

## 2022-04-14 NOTE — Progress Notes (Addendum)
   Assessment: 1. Microscopic hematuria; negative evaluation 3/23   2. Urge incontinence     Plan: Her urinalysis is negative for microscopic hematuria today. Her urge incontinence is not bothersome for her and she is not interested in any treatment at this time. Return to office as needed.  Chief Complaint:  Chief Complaint  Patient presents with   Hematuria   History of Present Illness:  Meredith Weber is a 61 y.o. year old female who is seen for further evaluation of microscopic hematuria.  Urinalysis results: 09/24/2020: 2 RBCs 04/08/2021: 5 RBCs 05/25/2021: Small blood on dipstick 11/22:  3-10 RBCs No history of gross hematuria.  She reports possibly noting some traces of blood on her toilet paper in September 2022.  She does have a remote history of kidney stones as a teenager.  No recent stone symptoms.  No recent UTIs.  She does have some chronic low back pain.  No dysuria.  She does have occasional urge incontinence but does not use any pads.  Her incontinence is not bothersome for her. No history of tobacco use.  CT hematuria protocol from 06/25/2021 showed no renal or ureteral calculi, bilateral renal cyst, small hypodense lesion left upper pole, mild bilateral renal atrophy. Cystoscopy from 3/23 showed no urethral or bladder abnormalities.  She returns today for follow-up.  No new urinary symptoms.  No dysuria or gross hematuria.  No flank pain.  Her incontinence is unchanged.  She is not interested in any treatment at this time.  Portions of the above documentation were copied from a prior visit for review purposes only.   Past Medical History:  Past Medical History:  Diagnosis Date   Spinal stenosis     Past Surgical History:  Past Surgical History:  Procedure Laterality Date   BREAST CYST EXCISION Left 2010   [place] ABLATION  2010    Allergies:  Allergies  Allergen Reactions   Cashew Nut Oil Anaphylaxis and Rash    Family  History:  No family history on file.  Social History:  Social History   Tobacco Use   Smoking status: Former    Packs/day: 1.00    Types: Cigarettes   Smokeless tobacco: Current  Substance Use Topics   Alcohol use: Yes   Drug use: No    ROS: Constitutional:  Negative for fever, chills, weight loss CV: Negative for chest pain, previous MI, hypertension Respiratory:  Negative for shortness of breath, wheezing, sleep apnea, frequent cough GI:  Negative for nausea, vomiting, bloody stool, GERD  Physical exam: BP 117/77   Pulse 85   Ht 4\' 11"  (1.499 m)   Wt 190 lb (86.2 kg)   BMI 38.38 kg/m  GENERAL APPEARANCE:  Well appearing, well developed, well nourished, NAD HEENT:  Atraumatic, normocephalic, oropharynx clear NECK:  Supple without lymphadenopathy or thyromegaly ABDOMEN:  Soft, non-tender, no masses EXTREMITIES:  Moves all extremities well, without clubbing, cyanosis, or edema NEUROLOGIC:  Alert and oriented x 3, normal gait, CN II-XII grossly intact MENTAL STATUS:  appropriate BACK:  Non-tender to palpation, No CVAT SKIN:  Warm, dry, and intact  Results: U/A: 0-5 WBCs, 0-2 RBCs

## 2022-04-14 NOTE — Addendum Note (Signed)
Addended by: Audie Box on: 04/14/2022 03:51 PM   Modules accepted: Orders

## 2022-04-15 LAB — URINALYSIS, ROUTINE W REFLEX MICROSCOPIC
Bilirubin, UA: NEGATIVE
Glucose, UA: NEGATIVE
Ketones, UA: NEGATIVE
Nitrite, UA: NEGATIVE
Protein,UA: NEGATIVE
Specific Gravity, UA: 1.02 (ref 1.005–1.030)
Urobilinogen, Ur: 1 mg/dL (ref 0.2–1.0)
pH, UA: 5 (ref 5.0–7.5)

## 2022-04-15 LAB — MICROSCOPIC EXAMINATION: Bacteria, UA: NONE SEEN

## 2023-02-26 IMAGING — CT CT ABD-PEL WO/W CM
3 of 12 series · 9 of 46 positions shown, 15 images · IV contrast (Omnipaque or Isovue)
Comparison: None.

CLINICAL DATA: Microscopic hematuria.

EXAM:
CT ABDOMEN AND PELVIS WITHOUT AND WITH CONTRAST
TECHNIQUE: Multidetector CT imaging of the abdomen and pelvis was performed
following the standard protocol before and following the bolus
administration of intravenous contrast.
CONTRAST:  100mL OMNIPAQUE IOHEXOL 300 MG/ML  SOLN

[Series 2: axial pre · axial · non-contrast · 0.83mm/px · z∈[+963,+1133]mm · 3 of 85 slices shown]
[im 17/85  soft-tissue]
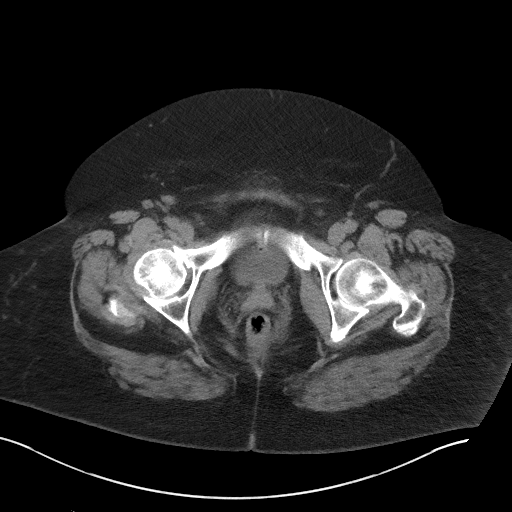
[im 34/85  soft-tissue]
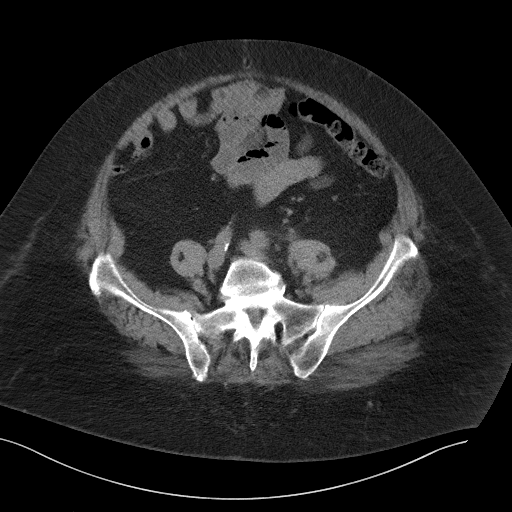
[im 51/85  soft-tissue]
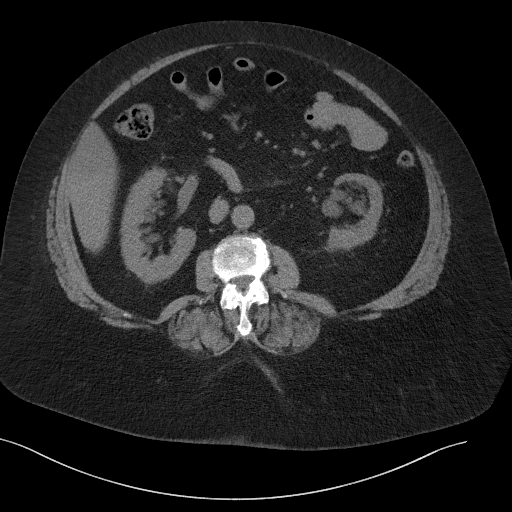

[Series 5: coronal pre · coronal · non-contrast · 0.82mm/px · 2 of 126 slices shown, 3 images]
[im 42/126  soft-tissue]
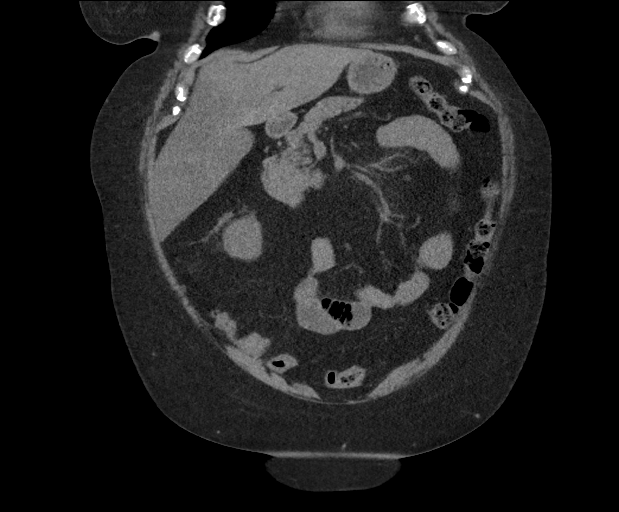
[im 42/126  bone]
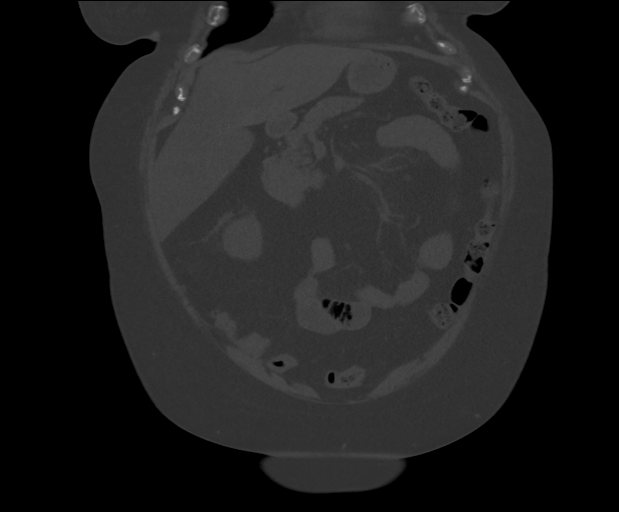
[im 84/126  soft-tissue]
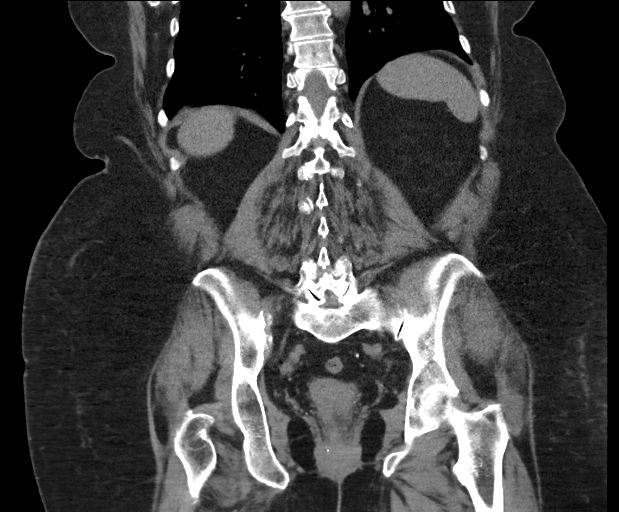

[Series 12: axial delay · axial · delayed · 0.73mm/px · z∈[+946,+1206]mm · 4 of 88 slices shown, 9 images]
[im 18/88  soft-tissue]
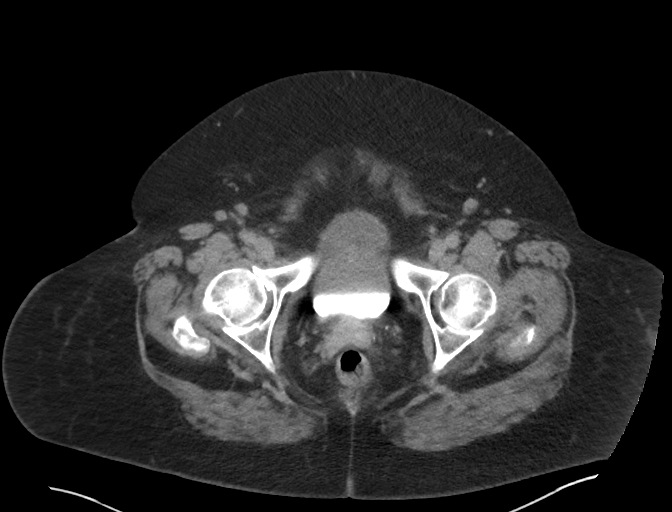
[im 18/88  lung]
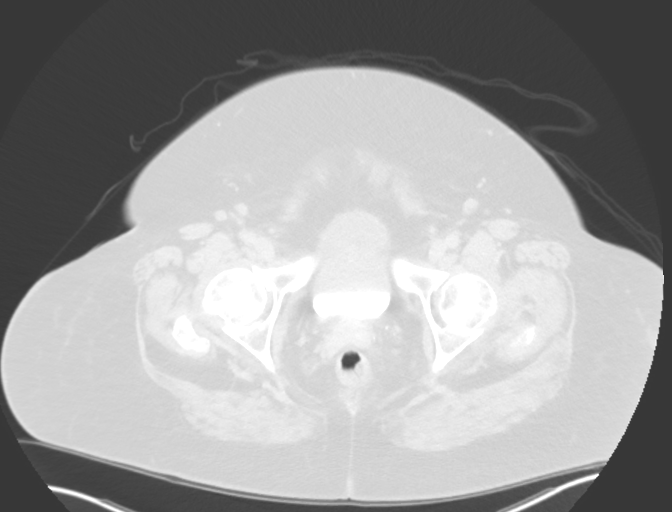
[im 18/88  bone]
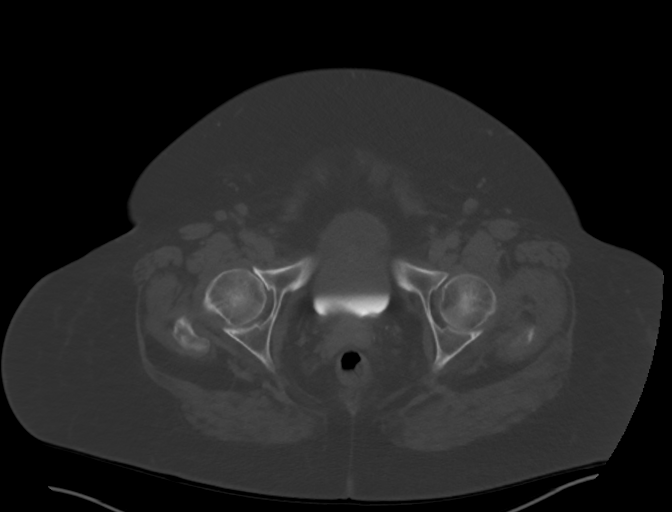
[im 35/88  soft-tissue]
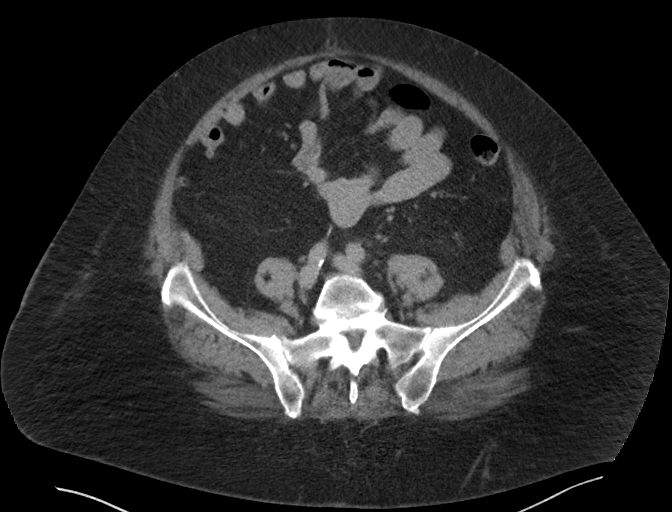
[im 35/88  lung]
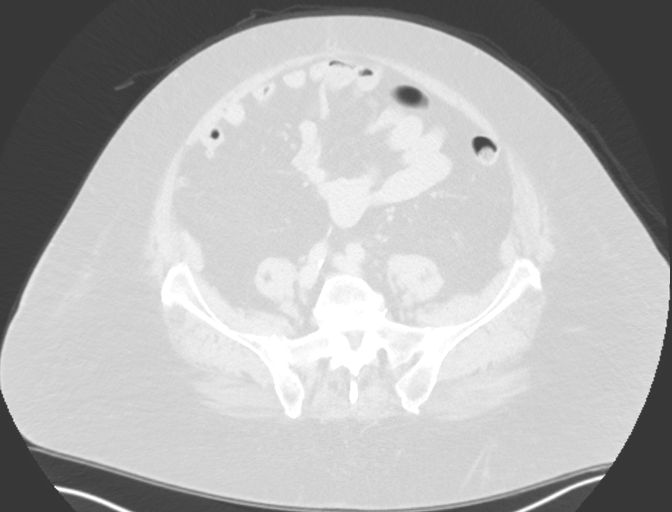
[im 53/88  soft-tissue]
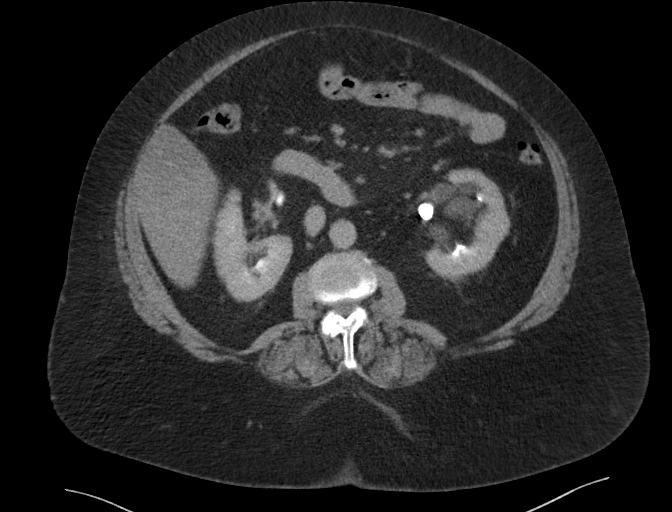
[im 53/88  lung]
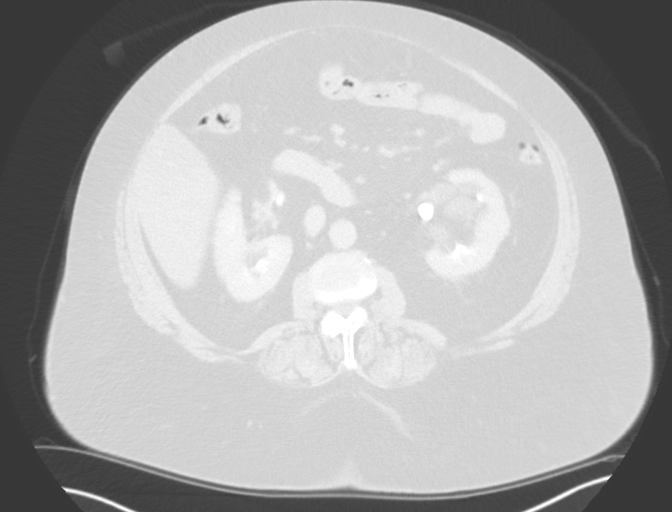
[im 70/88  soft-tissue]
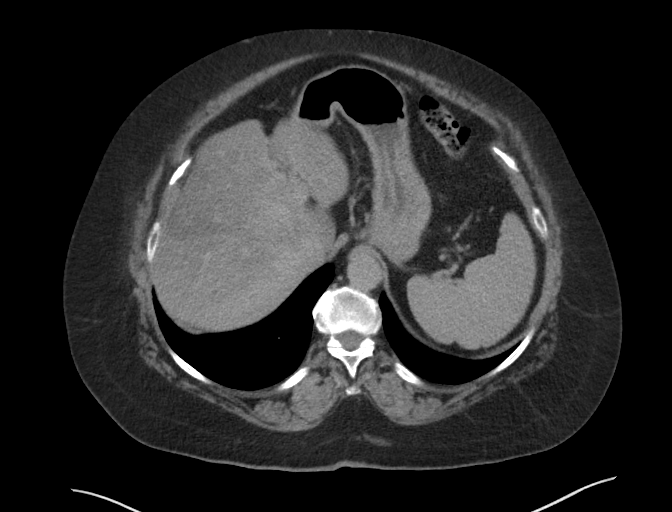
[im 70/88  lung]
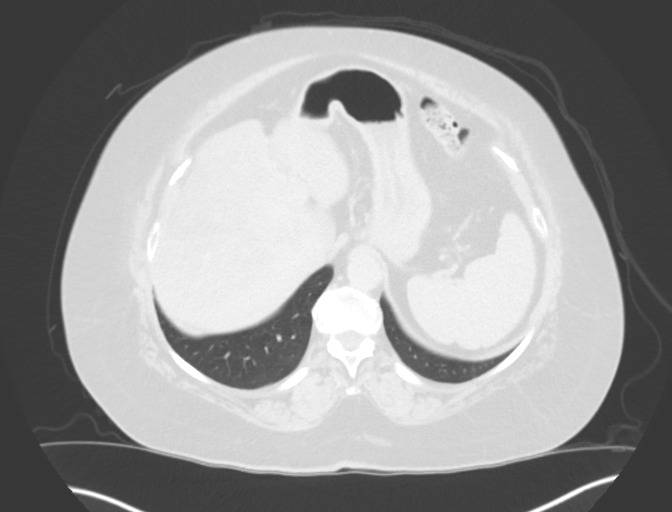

[9 of 46 positions shown; findings below may reference images not displayed]

FINDINGS: Lower chest: 3 mm subpleural nodule in the right middle lobe on
image 10 series [DATE] by 3 mm right lower lobe nodule on image 13
series 9. Linear subsegmental atelectasis or scarring in the
posterior basal segment right lower lobe. Mild cardiomegaly. Mild
mitral valve calcification.

Hepatobiliary: Geographic hypodensity in substantial portions of
segments 4, 5, and 8 of the liver, most likely from geographic
hepatic steatosis given the configuration which is not masslike, and
with vessels extending through these regions in a normal fashion.
Gallbladder unremarkable. No biliary dilatation.

Pancreas: Unremarkable

Spleen: Unremarkable

Adrenals/Urinary Tract: Both adrenal glands appear normal. No
urinary tract calculi or substantial abnormal enhancement along the
urothelium. Bilateral peripelvic cysts, greater on the left than the
right. Extrarenal pelvis on the right. No filling defect along the
urothelium is identified. Mild bilaterally in a renal atrophy
greater on the right than the left. 5 mm hypodense lesion of the
left kidney upper pole on image 66 series 15 is probably a cyst
although technically too small to characterize.

Stomach/Bowel: Unremarkable

Vascular/Lymphatic: Atherosclerosis is present, including aortoiliac
atherosclerotic disease. No pathologic adenopathy identified.

Reproductive: Slightly lobular lower uterine segment potentially
from a fibroid. Otherwise unremarkable.

Other: No supplemental non-categorized findings.

Musculoskeletal: Fatty prominence posterior to the right greater
trochanter and anterior to the gluteus maximus, potentially from an
underlying lipoma. Grade 1 degenerative anterolisthesis at L4-5 with
lower lumbar spondylosis and degenerative disc disease resulting in
bilateral foraminal impingement at L4-5 and L5-S1. Moderate
degenerative arthropathy of the right hip.
IMPRESSION: 1. A specific cause for hematuria is not identified. There bilateral
peripelvic cysts and a small hypodense lesion left kidney upper pole
is probably a cyst although technically too small to characterize.
2. Mild bilateral renal atrophy.
3. Right lower lobe nodules, largest 3 mm in diameter.
Multiple pulmonary nodules. Most severe: 3 mm right solid pulmonary
nodule. No routine follow-up imaging is recommended per [place]
Society Guidelines.
These guidelines do not apply to immunocompromised patients and
patients with cancer. Follow up in patients with significant
comorbidities as clinically warranted. For lung cancer screening,
adhere to Lung-RADS guidelines. Reference: Radiology. 9035;
284(1):228-43.
4. Mitral valve calcification.
5.  Aortic Atherosclerosis ([id number]).
6. Probable lower uterine segment fibroid.
7. Lower lumbar impingement at L4-5 and L5-S1.
8. Suspected small lipoma anterior to the left gluteus maximus
muscle.

## 2023-05-11 ENCOUNTER — Other Ambulatory Visit (INDEPENDENT_AMBULATORY_CARE_PROVIDER_SITE_OTHER): Payer: Self-pay

## 2023-05-11 ENCOUNTER — Ambulatory Visit (INDEPENDENT_AMBULATORY_CARE_PROVIDER_SITE_OTHER): Payer: Medicare Other | Admitting: Orthopedic Surgery

## 2023-05-11 ENCOUNTER — Encounter: Payer: Self-pay | Admitting: Orthopedic Surgery

## 2023-05-11 VITALS — BP 155/83 | HR 67 | Ht 59.0 in | Wt 193.0 lb

## 2023-05-11 DIAGNOSIS — M545 Low back pain, unspecified: Secondary | ICD-10-CM

## 2023-05-11 MED ORDER — METHOCARBAMOL 500 MG PO TABS: 500.0000 mg | ORAL_TABLET | Freq: Three times a day (TID) | ORAL | 0 refills | Status: DC | PRN

## 2023-05-11 MED ORDER — PREDNISONE 10 MG (21) PO TBPK: ORAL_TABLET | ORAL | 0 refills | Status: DC

## 2023-05-11 NOTE — Progress Notes (Signed)
New Patient Visit  Assessment: Meredith Weber is a 62 y.o. female with the following: 1. Lumbar pain  Plan: Meredith Weber has chronic low back pain.  Over the past 2 years, the pain has been progressively worsening.  She reports radiating pains in the right leg.  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  This appears to be unchanged from x-rays of 1 year ago.  An MRI report from 2012 is in our system, and this demonstrates severe facet arthropathy at this level, which is likely progressed.  Her symptoms have likely been exacerbated by recent stroke, which is affected the right side.  At this point, I would like for her to try some physical therapy.  She is in agreement with this plan.  In addition, I provided her with a prednisone Dosepak, as well as some Robaxin for her current pain.  After therapy, if she continues to have issues, I think we would be in a position to repeat the MRI of her lumbar spine, with plans to refer her to a spine surgeon or for consideration for injections.  Follow-up: Return in about 3 months (around 08/11/2023).  Subjective:  Chief Complaint  Patient presents with   Back Pain    R side LBP that radiates down the R leg to foot for 2 yrs progressively getting worse.     History of Present Illness: Meredith Weber is a 62 y.o. female who has been referred by Alvina Filbert, MD for evaluation of bilateral hip pain.  She states that she has had lower back pain for several years.  Over the past 2 years, she has had worsening pain in the right lower back, with radiating pains into the right leg.  She reports that she had a stroke approximately 2 years ago.  This has affected her right side.  Currently, she is having difficulty walking for more than 5 minutes.  She is comfortable when she is seated.  Pain is in the buttock, and occasionally goes to her right foot.  She denies numbness  and tingling.  She is currently taking ibuprofen.  She has had injections in her back, but this was many years ago.  She reports improvement following the injections.   Review of Systems: No fevers or chills No numbness or tingling No chest pain No shortness of breath No bowel or bladder dysfunction No GI distress No headaches   Medical History:  Past Medical History:  Diagnosis Date   Spinal stenosis     Past Surgical History:  Procedure Laterality Date   BREAST CYST EXCISION Left 2010   CESAREAN SECTION  1984   NOVASURE ABLATION  2010    History reviewed. No pertinent family history. Social History   Tobacco Use   Smoking status: Former    Current packs/day: 1.00    Types: Cigarettes   Smokeless tobacco: Current  Substance Use Topics   Alcohol use: Yes   Drug use: No    Allergies  Allergen Reactions   Cashew Nut Oil Anaphylaxis and Rash    Current Meds  Medication Sig   atorvastatin (LIPITOR) 20 MG tablet Take 20 mg by mouth daily.   methocarbamol (ROBAXIN) 500 MG tablet Take 1 tablet (500 mg total) by mouth every 8 (eight) hours as needed for muscle spasms.   predniSONE (STERAPRED UNI-PAK 21 TAB) 10 MG (21) TBPK tablet 10 mg DS 12 as directed  Objective: BP (!) 155/83   Pulse 67   Ht 4\' 11"  (1.499 m)   Wt 193 lb (87.5 kg)   BMI 38.98 kg/m   Physical Exam:  General: Alert and oriented. and No acute distress. Gait: Right sided antalgic gait.  Low back without deformity.  Tenderness to palpation along the lower back bilaterally.  She has tenderness within the musculature.  Bilateral lower extremity strength is good.  She does have some spasticity with range of motion of the right hip.  She notes some pain in the lateral aspect of the hip.  No pain in the groin with attempted internal or external rotation of the right hip.  She has smooth internal and external rotation of the left hip.  Negative straight leg raise laterally.  3+ patellar tendon reflex  on the right.  2+ patellar tendon reflex on the left.  Sensation is intact throughout the bilateral extremities.  IMAGING: I personally ordered and reviewed the following images   Standing lumbar spine x-rays were obtained in clinic today.  These are compared to available x-rays.  There is grade 1 anterolisthesis at L4-5, which remains unchanged compared to most recent x-rays.  Evidence of arthritis within the lower lumbar spine.  No acute injuries.  Fairly well-preserved disc heights overall.  No bony lesions.  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    Dispense:  30 tablet    Refill:  0      Oliver Barre, MD  05/11/2023 11:30 AM

## 2023-05-26 ENCOUNTER — Ambulatory Visit (HOSPITAL_COMMUNITY): Payer: Medicare Other | Attending: Orthopedic Surgery

## 2023-05-26 ENCOUNTER — Other Ambulatory Visit: Payer: Self-pay

## 2023-05-26 DIAGNOSIS — R29898 Other symptoms and signs involving the musculoskeletal system: Secondary | ICD-10-CM | POA: Insufficient documentation

## 2023-05-26 DIAGNOSIS — R262 Difficulty in walking, not elsewhere classified: Secondary | ICD-10-CM | POA: Diagnosis present

## 2023-05-26 DIAGNOSIS — R278 Other lack of coordination: Secondary | ICD-10-CM | POA: Insufficient documentation

## 2023-05-26 DIAGNOSIS — M545 Low back pain, unspecified: Secondary | ICD-10-CM | POA: Diagnosis not present

## 2023-05-26 DIAGNOSIS — M6281 Muscle weakness (generalized): Secondary | ICD-10-CM | POA: Insufficient documentation

## 2023-05-26 NOTE — Therapy (Signed)
[facility] OUTPATIENT PHYSICAL THERAPY EVALUATION (THORACOLUMBAR)   Patient Name: Meredith Weber MRN: [id number] DOB:15-Dec-1960, 62 y.o., female Today's Date: 05/26/2023  END OF SESSION:   PT End of Session - 05/26/23 1101     Visit Number 1    Number of Visits 10    Authorization Type Medicare Part A & B    Progress Note Due on Visit 10    PT Start Time 1100    PT Stop Time 1140    PT Time Calculation (min) 40 min    Activity Tolerance Patient tolerated treatment well              Past Medical History:  Diagnosis Date   Spinal stenosis    Past Surgical History:  Procedure Laterality Date   BREAST CYST EXCISION Left 2010   CESAREAN SECTION  1984   NOVASURE ABLATION  2010   Patient Active Problem List   Diagnosis Date Noted   Microscopic hematuria 06/10/2021   Urge incontinence 06/10/2021   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   Strain of knee 07/23/2019   Benign essential hypertension 11/15/2014   Chest pain, atypical 11/15/2014   Abdominal wall hematoma 11/07/2012    PCP: Alvina Filbert, MDRef Provider (PCP)   REFERRING PROVIDER:   Oliver Barre, MD    REFERRING DIAG:  Diagnosis  M54.50 (ICD-10-CM) - Lumbar pain    Rationale for Evaluation and Treatment: Rehabilitation  THERAPY DIAG:  Muscle weakness (generalized)  Right leg weakness  Difficulty in walking, not elsewhere classified  ONSET DATE: 03/2021 left sided CVA --------------------------------------------------------------------------------------------- SUBJECTIVE:                                                                                                                                                                                           SUBJECTIVE STATEMENT: Patient with left Sided CVA 2022; patient underwent rehab. Patient reports general weakness progressed over a year. It has gotten to a point that the patient is unable to stand without pain. Patient  went to Prthopedic MD 10/23; MD prescribed prednisone and referred patient to OPPT.  PERTINENT HISTORY:  Left CVA 2022 HTN Spinal stenosis    PAIN:  Are you having pain? Yes: NPRS scale: 4/10 Pain location: lumbar into right hip/thigh Pain description: sharp Aggravating factors: walking, standing  Relieving factors: rest  PRECAUTIONS: None  RED FLAGS: None   WEIGHT BEARING RESTRICTIONS: No  FALLS:  Has patient fallen in last 6 months? No  LIVING ENVIRONMENT: Lives with: lives with their family and lives with their spouse Lives in: House/apartment Stairs: Yes: External: 5 steps; can reach both Has following equipment  at home: Single point cane and rollator How many hours do you sleep every night: 6-8 How many minutes of exercise do you get weekly: none other than ADLs  OCCUPATION: out of work  PLOF: Independent   PATIENT GOALS: to walk pain free   NEXT MD VISIT: Aug 12 2023 --------------------------------------------------------------------------------------------- OBJECTIVE:   DIAGNOSTIC FINDINGS:  Lumbar spine x-rays with 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, and stable anterolisthesis of L4-5   PATIENT SURVEYS:  Modified Oswestry Low Back Pain Disability Questionnaire: 26 / 50 = 52.0 %  SCREENING FOR RED FLAGS: Bowel or bladder incontinence: No Spinal tumors: No Cauda equina syndrome: No Compression fracture: No Abdominal aneurysm: No  COGNITION: Overall cognitive status: Within functional limits for tasks assessed  POSTURE: weight shift left      FUNCTIONAL TESTS:  5 times sit to stand: 18s with minimal unsteadiness  Age 46-69: 8.4  0.0 seconds for men, 12.7  1.8 seconds for women >16 seconds: The participant has a higher risk of falls      GAIT ANALYSIS: Distance walked: 25 feet Assistive device utilized: None Level of assistance: SBA Comments: moderate antalgia on right lower extremity    SENSATION: WFL  TONE  +1 Ashworth Scale to right lower extremity    LOWER EXTREMITY MMT:    Left ankle/hip/knee grossly 4/5 except:  Right ankle/hip/knee grossly 4-/5 except:   Right ankle dorsiflexion 3+/5  Right hip ADD 3+/5   Right hip flexion 3-/5  LOWER EXTREMITY ROM:      Right ankle/hip/knee AROM grossly WFL except:   Right hip flexion 0-90 pain  Right hip IR 0-10*  PALPATION: Mod tenderness to palpation right hip bursae, gluteals  --------------------------------------------------------------------------------------------- TODAY'S TREATMENT:                                                                                                                              DATE:  05/26/23 - Hooklying Single Leg Bent Knee Fallouts with Resistance  x10 - Bridge with Abduction and Resistance Loop  x10   PATIENT EDUCATION:  Education details: HEP Person educated: Patient Education method: Explanation Education comprehension: verbalized understanding  HOME EXERCISE PROGRAM: Access Code: [email or web address] URL: [email or web address] Date: 05/26/2023 Prepared by: Seymour Bars  Exercises - Hooklying Single Leg Bent Knee Fallouts with Resistance  - 2 x daily - 25 reps - Bridge with Abduction and Resistance Loop  - 2 x daily - 25 reps --------------------------------------------------------------------------------------------- ASSESSMENT:  CLINICAL IMPRESSION: Patient is a 62 y.o. y.o. female who was seen today for physical therapy evaluation and treatment for difficulty walking, lower extremity/ right leg weakness . Patient presents to PT with the following objective impairments: Abnormal gait, decreased activity tolerance, decreased endurance, difficulty walking, decreased ROM, decreased strength, postural dysfunction, and pain. These impairments limit the patient in activities such as carrying, lifting, bending, standing, squatting, stairs, transfers, locomotion  level, and caring for others. These impairments also limit the patient in participation such as  meal prep, cleaning, laundry, driving, shopping, community activity, and yard work. The patient will benefit from PT to address the limitations/impairments listed below to return to their prior level of function in the domains of activity and participation.    PERSONAL FACTORS: 1 comorbidity: PMH left CVA  are also affecting patient's functional outcome.   REHAB POTENTIAL: Good  CLINICAL DECISION MAKING: Stable/uncomplicated  EVALUATION COMPLEXITY: Low  --------------------------------------------------------------------------------------------- GOALS: Goals reviewed with patient? No  SHORT TERM GOALS: Target date: 06/23/2023    Patient will be able to score a </= 48 on the Modified Oswestry    to demonstrate an improvement in overall housework, ADL completion, mobility, and self-care. Baseline: Goal status: INITIAL  2. Patient will be independent with a basic stretching/strengthening HEP  Baseline:  Goal status: INITIAL   LONG TERM GOALS: Target date: 07/21/2023   Patient will be able to score a </=42 on the Modified Oswestry    to demonstrate an improvement in overall housework, ADL completion, mobility, and self-care.Baseline:  Goal status: INITIAL  2.   Patient will complete the 5 times sit to stand:    within 15s  to demonstrate decreased falls risk Baseline:  Goal status: INITIAL  3.  Patient will be independent with a comprehensive strengthening HEP  Baseline:  Goal status: INITIAL  --------------------------------------------------------------------------------------------- PLAN:  PT FREQUENCY: 1-2x/week  PT DURATION: 8 weeks  PLANNED INTERVENTIONS: 97110-Therapeutic exercises, 97530- Therapeutic activity, 97112- Neuromuscular re-education, 97535- Self Care, 95638- Manual therapy, [id number]- Gait training, Patient/Family education, Balance training, Stair training, Dry  Needling, Joint mobilization, Joint manipulation, Spinal manipulation, Spinal mobilization, Vestibular training, DME instructions, Cryotherapy, and Moist heat.  PLAN FOR NEXT SESSION: Progress lower extremity strengthening    Seymour Bars, PT 05/26/2023, 11:07 AM

## 2023-05-26 NOTE — Addendum Note (Signed)
Addended by: Seymour Bars on: 05/26/2023 03:55 PM   Modules accepted: Orders

## 2023-05-30 ENCOUNTER — Ambulatory Visit (HOSPITAL_COMMUNITY): Payer: Medicare Other

## 2023-05-30 ENCOUNTER — Encounter (HOSPITAL_COMMUNITY): Payer: Self-pay

## 2023-05-30 DIAGNOSIS — M6281 Muscle weakness (generalized): Secondary | ICD-10-CM

## 2023-05-30 DIAGNOSIS — R262 Difficulty in walking, not elsewhere classified: Secondary | ICD-10-CM | POA: Diagnosis not present

## 2023-05-30 DIAGNOSIS — R29898 Other symptoms and signs involving the musculoskeletal system: Secondary | ICD-10-CM

## 2023-05-30 NOTE — Therapy (Signed)
[facility] OUTPATIENT PHYSICAL THERAPY EVALUATION (THORACOLUMBAR)   Patient Name: Meredith Weber MRN: [id number] DOB:[birth year], 62 y.o., female Today's Date: 05/30/2023  END OF SESSION:   PT End of Session - 05/30/23 1152     Visit Number 2    Number of Visits 10    Authorization Type Medicare Part A & B    Progress Note Due on Visit 10    PT Start Time 1145    PT Stop Time 1225    PT Time Calculation (min) 40 min    Activity Tolerance Patient tolerated treatment well              Past Medical History:  Diagnosis Date   Spinal stenosis    Past Surgical History:  Procedure Laterality Date   BREAST CYST EXCISION Left 2010   CESAREAN SECTION  1984   NOVASURE ABLATION  2010   Patient Active Problem List   Diagnosis Date Noted   Microscopic hematuria 06/10/2021   Urge incontinence 06/10/2021   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  ONSET DATE: 03/2021 left sided CVA --------------------------------------------------------------------------------------------- SUBJECTIVE:                                                                                                                                                                                           SUBJECTIVE STATEMENT: Today: Patient with 4/10 pain in the right hip down to knee   IE: Patient with left Sided CVA 2022; patient underwent rehab. Patient reports general weakness progressed over a year. It has gotten to a point that the patient is unable to stand without pain. Patient went to  Prthopedic MD 10/23; MD prescribed prednisone and referred patient to OPPT.  PERTINENT HISTORY:  Left CVA 2022 HTN Spinal stenosis    PAIN:  Are you having pain? Yes: NPRS scale: 4/10 Pain location: lumbar into right hip/thigh Pain description: sharp Aggravating factors: walking, standing  Relieving factors: rest  PRECAUTIONS: None  RED FLAGS: None   WEIGHT BEARING RESTRICTIONS: No  FALLS:  Has patient fallen in last 6 months? No  LIVING ENVIRONMENT: Lives with: lives with their family and lives with their spouse Lives in: House/apartment Stairs: Yes: External: 5 steps; can reach  both Has following equipment at home: Single point cane and rollator How many hours do you sleep every night: 6-8 How many minutes of exercise do you get weekly: none other than ADLs  OCCUPATION: out of work  PLOF: Independent   PATIENT GOALS: to walk pain free   NEXT MD VISIT: Aug 12 2023 --------------------------------------------------------------------------------------------- OBJECTIVE:   DIAGNOSTIC FINDINGS:  Lumbar spine x-rays with 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, and stable anterolisthesis of L4-5   PATIENT SURVEYS:  Modified Oswestry Low Back Pain Disability Questionnaire: 26 / 50 = 52.0 %  SCREENING FOR RED FLAGS: Bowel or bladder incontinence: No Spinal tumors: No Cauda equina syndrome: No Compression fracture: No Abdominal aneurysm: No  COGNITION: Overall cognitive status: Within functional limits for tasks assessed  POSTURE: weight shift left      FUNCTIONAL TESTS:  5 times sit to stand: 18s with minimal unsteadiness  Age 64-69: 8.4  0.0 seconds for men, 12.7  1.8 seconds for women >16 seconds: The participant has a higher risk of falls      GAIT ANALYSIS: Distance walked: 25 feet Assistive device utilized: None Level of assistance: SBA Comments: moderate antalgia on right lower extremity   SENSATION: WFL  TONE  +1  Ashworth Scale to right lower extremity    LOWER EXTREMITY MMT:    Left ankle/hip/knee grossly 4/5 except:  Right ankle/hip/knee grossly 4-/5 except:   Right ankle dorsiflexion 3+/5  Right hip ADD 3+/5   Right hip flexion 3-/5  LOWER EXTREMITY ROM:      Right ankle/hip/knee AROM grossly WFL except:   Right hip flexion 0-90 pain  Right hip IR 0-10*  PALPATION: Mod tenderness to palpation right hip bursae, gluteals  --------------------------------------------------------------------------------------------- TODAY'S TREATMENT:                                                                                                                              DATE:  05/30/23 NuStep Seat 4 L2 warm up Supine  Hip abduction with B theraband 2x10  Bridge + theraband abduction with B theraband- NT   Bilateral clamshells 2x10  05/26/23 - Hooklying Single Leg Bent Knee Fallouts with Resistance  x10 - Bridge with Abduction and Resistance Loop  x10   PATIENT EDUCATION:  Education details: HEP Person educated: Patient Education method: Explanation Education comprehension: verbalized understanding  HOME EXERCISE PROGRAM: Access Code: [email or web address] URL: [email or web address] Date: 05/30/2023 Prepared by: Seymour Bars  Exercises - Hooklying Clamshell with Resistance  - 1 x daily - 2 sets - 10 reps - 3 second hold - Clamshell  - 1 x daily - 2 sets - 10 reps - 3 second hold - Beginner Bridge  - 1 x daily - 2 sets - 10 reps - 3 second  hold - Clamshell (Mirrored)  - 1 x daily - 2 sets - 10 reps - 3 second hold --------------------------------------------------------------------------------------------- ASSESSMENT:  CLINICAL IMPRESSION:  PT initiated basic lower extremity strength HEP.(See list  above). Patient able to tolerated new HEP well with no increased pain levels; did not have the stability to do single leg bridges. Patient requires minimal verbal/tactile cues for proper  progression.  Patient will continue to benefit from PT to return to PLOF   IE: Patient is a 62 y.o. y.o. female who was seen today for physical therapy evaluation and treatment for difficulty walking, lower extremity/ right leg weakness . Patient presents to PT with the following objective impairments: Abnormal gait, decreased activity tolerance, decreased endurance, difficulty walking, decreased ROM, decreased strength, postural dysfunction, and pain. These impairments limit the patient in activities such as carrying, lifting, bending, standing, squatting, stairs, transfers, locomotion level, and caring for others. These impairments also limit the patient in participation such as meal prep, cleaning, laundry, driving, shopping, community activity, and yard work. The patient will benefit from PT to address the limitations/impairments listed below to return to their prior level of function in the domains of activity and participation.    PERSONAL FACTORS: 1 comorbidity: PMH left CVA  are also affecting patient's functional outcome.   REHAB POTENTIAL: Good  CLINICAL DECISION MAKING: Stable/uncomplicated  EVALUATION COMPLEXITY: Low  --------------------------------------------------------------------------------------------- GOALS: Goals reviewed with patient? No  SHORT TERM GOALS: Target date: 06/23/2023    Patient will be able to score a </= 48 on the Modified Oswestry    to demonstrate an improvement in overall housework, ADL completion, mobility, and self-care. Baseline: Goal status: INITIAL  2. Patient will be independent with a basic stretching/strengthening HEP  Baseline:  Goal status: INITIAL   LONG TERM GOALS: Target date: 07/21/2023   Patient will be able to score a </=42 on the Modified Oswestry    to demonstrate an improvement in overall housework, ADL completion, mobility, and self-care.Baseline:  Goal status: INITIAL  2.   Patient will complete the 5 times sit to stand:     within 15s  to demonstrate decreased falls risk Baseline:  Goal status: INITIAL  3.  Patient will be independent with a comprehensive strengthening HEP  Baseline:  Goal status: INITIAL  --------------------------------------------------------------------------------------------- PLAN:  PT FREQUENCY: 1-2x/week  PT DURATION: 8 weeks  PLANNED INTERVENTIONS: 97110-Therapeutic exercises, 97530- Therapeutic activity, 97112- Neuromuscular re-education, 97535- Self Care, [id number]- Manual therapy, [phone]- Gait training, Patient/Family education, Balance training, Stair training, Dry Needling, Joint mobilization, Joint manipulation, Spinal manipulation, Spinal mobilization, Vestibular training, DME instructions, Cryotherapy, and Moist heat.  PLAN FOR NEXT SESSION: Progress lower extremity strengthening    Seymour Bars, PT 05/30/2023, 11:52 AM

## 2023-06-02 ENCOUNTER — Ambulatory Visit (HOSPITAL_COMMUNITY): Payer: Medicare Other

## 2023-06-02 ENCOUNTER — Encounter (HOSPITAL_COMMUNITY): Payer: Self-pay

## 2023-06-02 ENCOUNTER — Encounter (HOSPITAL_COMMUNITY): Payer: Medicare Other

## 2023-06-02 DIAGNOSIS — R262 Difficulty in walking, not elsewhere classified: Secondary | ICD-10-CM

## 2023-06-02 DIAGNOSIS — R278 Other lack of coordination: Secondary | ICD-10-CM

## 2023-06-02 DIAGNOSIS — R29898 Other symptoms and signs involving the musculoskeletal system: Secondary | ICD-10-CM

## 2023-06-02 DIAGNOSIS — M6281 Muscle weakness (generalized): Secondary | ICD-10-CM

## 2023-06-02 NOTE — Therapy (Signed)
[facility] OUTPATIENT PHYSICAL THERAPY TREATMENT (THORACOLUMBAR)   Patient Name: Meredith Weber MRN: [id number] DOB:April 25, 1961, 62 y.o., female Today's Date: 06/02/2023  END OF SESSION:   PT End of Session - 06/02/23 1120     Visit Number 3    Number of Visits 10    Date for PT Re-Evaluation 06/23/23    Authorization Type Medicare Part A & B    Progress Note Due on Visit 10    PT Start Time 1120    PT Stop Time 1205    PT Time Calculation (min) 45 min    Activity Tolerance Patient tolerated treatment well;Patient limited by fatigue    Behavior During Therapy [facility] for tasks assessed/performed              Past Medical History:  Diagnosis Date   Spinal stenosis    Past Surgical History:  Procedure Laterality Date   BREAST CYST EXCISION Left 2010   CESAREAN SECTION  1984   NOVASURE ABLATION  2010   Patient Active Problem List   Diagnosis Date Noted   Microscopic hematuria 06/10/2021   Urge incontinence 06/10/2021   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   Strain of knee 07/23/2019   Benign essential hypertension 11/15/2014   Chest pain, atypical 11/15/2014   Abdominal wall hematoma 11/07/2012    PCP: Alvina Filbert, MDRef Provider (PCP)   REFERRING PROVIDER:   Oliver Barre, MD    REFERRING DIAG:  Diagnosis  M54.50 (ICD-10-CM) - Lumbar pain    Rationale for Evaluation and Treatment: Rehabilitation  THERAPY DIAG:  Difficulty in walking, not elsewhere classified  Right leg weakness  Other symptoms and signs involving the musculoskeletal system  Other lack of coordination  Muscle weakness (generalized)  ONSET DATE: 03/2021 left sided CVA --------------------------------------------------------------------------------------------- SUBJECTIVE:                                                                                                                                                                                            SUBJECTIVE STATEMENT: 06/02/23:  Pain scale 5/10 LBP and Rt hip down to knee.   Reports complaince iwht HEP dailiy.  Feels increased weakness/instability with Rt side.    IE: Patient with left Sided CVA 2022; patient underwent rehab. Patient reports general weakness progressed over a year. It has gotten to a point that the patient is unable to stand without pain. Patient went to Prthopedic MD 10/23; MD prescribed prednisone and referred patient to OPPT.  PERTINENT HISTORY:  Left CVA 2022 HTN Spinal stenosis    PAIN:  Are you having pain? Yes: NPRS scale: 4/10 Pain location: lumbar into  right hip/thigh Pain description: sharp Aggravating factors: walking, standing  Relieving factors: rest  PRECAUTIONS: None  RED FLAGS: None   WEIGHT BEARING RESTRICTIONS: No  FALLS:  Has patient fallen in last 6 months? No  LIVING ENVIRONMENT: Lives with: lives with their family and lives with their spouse Lives in: House/apartment Stairs: Yes: External: 5 steps; can reach both Has following equipment at home: Single point cane and rollator How many hours do you sleep every night: 6-8 How many minutes of exercise do you get weekly: none other than ADLs  OCCUPATION: out of work  PLOF: Independent   PATIENT GOALS: to walk pain free   NEXT MD VISIT: Aug 12 2023 --------------------------------------------------------------------------------------------- OBJECTIVE:   DIAGNOSTIC FINDINGS:  Lumbar spine x-rays with 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, and stable anterolisthesis of L4-5   PATIENT SURVEYS:  Modified Oswestry Low Back Pain Disability Questionnaire: 26 / 50 = 52.0 %  SCREENING FOR RED FLAGS: Bowel or bladder incontinence: No Spinal tumors: No Cauda equina syndrome: No Compression fracture: No Abdominal aneurysm: No  COGNITION: Overall cognitive status: Within functional limits for tasks assessed  POSTURE: weight shift left      FUNCTIONAL  TESTS:  5 times sit to stand: 18s with minimal unsteadiness  Age 58-69: 8.4  0.0 seconds for men, 12.7  1.8 seconds for women >16 seconds: The participant has a higher risk of falls      GAIT ANALYSIS: Distance walked: 25 feet Assistive device utilized: None Level of assistance: SBA Comments: moderate antalgia on right lower extremity   SENSATION: WFL  TONE  +1 Ashworth Scale to right lower extremity    LOWER EXTREMITY MMT:    Left ankle/hip/knee grossly 4/5 except:  Right ankle/hip/knee grossly 4-/5 except:   Right ankle dorsiflexion 3+/5  Right hip ADD 3+/5   Right hip flexion 3-/5  LOWER EXTREMITY ROM:      Right ankle/hip/knee AROM grossly WFL except:   Right hip flexion 0-90 pain  Right hip IR 0-10*  PALPATION: Mod tenderness to palpation right hip bursae, gluteals  --------------------------------------------------------------------------------------------- TODAY'S TREATMENT:                                                                                                                              DATE: 06/02/23:   NuStep Seat 4 L2 warm up [place] x 5' 160ft with SPC with cueing for sequence, therapist adjusted cane height Sidelying: Clam with RTB 10x 5" Supine: Bridge with RTB around thigh 10X 5"  Standing: lumbar extension Seated: STS no HHA eccentric control 2x 5 reps   05/30/23 NuStep Seat 4 L2 warm up Supine  Hip abduction with B theraband 2x10  Bridge + theraband abduction with B theraband- NT   Bilateral clamshells 2x10  05/26/23 - Hooklying Single Leg Bent Knee Fallouts with Resistance  x10 - Bridge with Abduction and Resistance Loop  x10   PATIENT EDUCATION:  Education details: HEP Person  educated: Patient Education method: Explanation Education comprehension: verbalized understanding  HOME EXERCISE PROGRAM: Access Code: [email or web address] URL: [email or web address] Date: 05/30/2023 Prepared by: Seymour Bars  Exercises - Hooklying Clamshell with Resistance  - 1 x daily - 2 sets - 10 reps - 3 second hold - Clamshell  - 1 x daily - 2 sets - 10 reps - 3 second hold - Beginner Bridge  - 1 x daily - 2 sets - 10 reps - 3 second  hold - Clamshell (Mirrored)  - 1 x daily - 2 sets - 10 reps - 3 second hold  06/02/23:  --------------------------------------------------------------------------------------------- ASSESSMENT:  CLINICAL IMPRESSION: Began session with Nustep, pt required 4 (maybe 5) short duration rest breaks due to fatigue.  Pt educated on benefits of beginning walking program to improve activity tolerance.  complete this session, with cueing for proper sequence with SPC, therapist adjusted cane height.  Therex focus with proximal strengthening, added resistance with sidelying clam.  Pt tolerated well to session with no reports of increased pain, was limited by fatigue.  PT initiated basic lower extremity strength HEP.(See list above). Patient able to tolerated new HEP well with no increased pain levels; did not have the stability to do single leg bridges. Patient requires minimal verbal/tactile cues for proper progression.  Patient will continue to benefit from PT to return to PLOF   IE: Patient is a 62 y.o. y.o. female who was seen today for physical therapy evaluation and treatment for difficulty walking, lower extremity/ right leg weakness . Patient presents to PT with the following objective impairments: Abnormal gait, decreased activity tolerance, decreased endurance, difficulty walking, decreased ROM, decreased strength, postural dysfunction, and pain. These impairments limit the patient in activities such as carrying, lifting, bending, standing, squatting, stairs, transfers, locomotion level, and caring for others. These impairments also limit the patient in participation such as meal prep, cleaning, laundry, driving, shopping, community activity, and yard work. The patient  will benefit from PT to address the limitations/impairments listed below to return to their prior level of function in the domains of activity and participation.    PERSONAL FACTORS: 1 comorbidity: PMH left CVA  are also affecting patient's functional outcome.   REHAB POTENTIAL: Good  CLINICAL DECISION MAKING: Stable/uncomplicated  EVALUATION COMPLEXITY: Low  --------------------------------------------------------------------------------------------- GOALS: Goals reviewed with patient? No  SHORT TERM GOALS: Target date: 06/23/2023    Patient will be able to score a </= 48 on the Modified Oswestry    to demonstrate an improvement in overall housework, ADL completion, mobility, and self-care. Baseline: Goal status: INITIAL  2. Patient will be independent with a basic stretching/strengthening HEP  Baseline:  Goal status: INITIAL   LONG TERM GOALS: Target date: 07/21/2023   Patient will be able to score a </=42 on the Modified Oswestry    to demonstrate an improvement in overall housework, ADL completion, mobility, and self-care.Baseline:  Goal status: INITIAL  2.   Patient will complete the 5 times sit to stand:    within 15s  to demonstrate decreased falls risk Baseline:  Goal status: INITIAL  3.  Patient will be independent with a comprehensive strengthening HEP  Baseline:  Goal status: INITIAL  --------------------------------------------------------------------------------------------- PLAN:  PT FREQUENCY: 1-2x/week  PT DURATION: 8 weeks  PLANNED INTERVENTIONS: 97110-Therapeutic exercises, 97530- Therapeutic activity, 97112- Neuromuscular re-education, 97535- Self Care, 95621- Manual therapy, [phone]- Gait training, Patient/Family education, Balance training, Stair training, Dry Needling, Joint mobilization, Joint manipulation, Spinal manipulation, Spinal mobilization, Vestibular training, DME instructions, Cryotherapy, and Moist  heat.  PLAN FOR NEXT SESSION: Progress  lower extremity strengthening   Becky Sax, LPTA/CLT; CBIS [phone]  Juel Burrow, PTA 06/02/2023, 12:55 PM

## 2023-06-06 ENCOUNTER — Ambulatory Visit (HOSPITAL_COMMUNITY): Payer: Medicare Other

## 2023-06-06 DIAGNOSIS — R262 Difficulty in walking, not elsewhere classified: Secondary | ICD-10-CM

## 2023-06-06 DIAGNOSIS — R29898 Other symptoms and signs involving the musculoskeletal system: Secondary | ICD-10-CM

## 2023-06-06 NOTE — Therapy (Signed)
[facility] OUTPATIENT PHYSICAL THERAPY TREATMENT (THORACOLUMBAR)   Patient Name: Meredith Weber MRN: [id number] DOB:20-Jun-1961, 62 y.o., female Today's Date: 06/06/2023  END OF SESSION:   PT End of Session - 06/06/23 1147     Visit Number 4    Number of Visits 10    Date for PT Re-Evaluation 06/23/23    Authorization Type Medicare Part A & B    Progress Note Due on Visit 10    PT Start Time 1147    PT Stop Time 1227    PT Time Calculation (min) 40 min    Activity Tolerance Patient tolerated treatment well;Patient limited by fatigue    Behavior During Therapy [facility] for tasks assessed/performed              Past Medical History:  Diagnosis Date   Spinal stenosis    Past Surgical History:  Procedure Laterality Date   BREAST CYST EXCISION Left 2010   CESAREAN SECTION  1984   NOVASURE ABLATION  2010   Patient Active Problem List   Diagnosis Date Noted   Microscopic hematuria 06/10/2021   Urge incontinence 06/10/2021   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   Strain of knee 07/23/2019   Benign essential hypertension 11/15/2014   Chest pain, atypical 11/15/2014   Abdominal wall hematoma 11/07/2012    PCP: Alvina Filbert, MDRef Provider (PCP)   REFERRING PROVIDER:   Oliver Barre, MD    REFERRING DIAG:  Diagnosis  M54.50 (ICD-10-CM) - Lumbar pain    Rationale for Evaluation and Treatment: Rehabilitation  THERAPY DIAG:  Difficulty in walking, not elsewhere classified  Right leg weakness  Other symptoms and signs involving the musculoskeletal system  ONSET DATE: 03/2021 left sided CVA --------------------------------------------------------------------------------------------- SUBJECTIVE:                                                                                                                                                                                           SUBJECTIVE STATEMENT: Today:  Patient with NPRS 5/10 pain for  lumbar and Rt hip down to knee. Reports non compliance with walking program.    IE: Patient with left Sided CVA 2022; patient underwent rehab. Patient reports general weakness progressed over a year. It has gotten to a point that the patient is unable to stand without pain. Patient went to Prthopedic MD 10/23; MD prescribed prednisone and referred patient to OPPT.  PERTINENT HISTORY:  Left CVA 2022 HTN Spinal stenosis    PAIN:  Are you having pain? Yes: NPRS scale: 4/10 Pain location: lumbar into right hip/thigh Pain description: sharp Aggravating factors: walking, standing  Relieving factors: rest  PRECAUTIONS: None  RED FLAGS: None   WEIGHT BEARING RESTRICTIONS: No  FALLS:  Has patient fallen in last 6 months? No  LIVING ENVIRONMENT: Lives with: lives with their family and lives with their spouse Lives in: House/apartment Stairs: Yes: External: 5 steps; can reach both Has following equipment at home: Single point cane and rollator How many hours do you sleep every night: 6-8 How many minutes of exercise do you get weekly: none other than ADLs  OCCUPATION: out of work  PLOF: Independent   PATIENT GOALS: to walk pain free   NEXT MD VISIT: Aug 12 2023 --------------------------------------------------------------------------------------------- OBJECTIVE:   DIAGNOSTIC FINDINGS:  Lumbar spine x-rays with 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, and stable anterolisthesis of L4-5   PATIENT SURVEYS:  Modified Oswestry Low Back Pain Disability Questionnaire: 26 / 50 = 52.0 %  SCREENING FOR RED FLAGS: Bowel or bladder incontinence: No Spinal tumors: No Cauda equina syndrome: No Compression fracture: No Abdominal aneurysm: No  COGNITION: Overall cognitive status: Within functional limits for tasks assessed  POSTURE: weight shift left      FUNCTIONAL TESTS:  5 times sit to stand: 18s with minimal unsteadiness  Age 57-69: 8.4  0.0 seconds for  men, 12.7  1.8 seconds for women >16 seconds: The participant has a higher risk of falls   : 168ft     GAIT ANALYSIS: Distance walked: 25 feet Assistive device utilized: None Level of assistance: SBA Comments: moderate antalgia on right lower extremity   SENSATION: WFL  TONE  +1 Ashworth Scale to right lower extremity    LOWER EXTREMITY MMT:    Left ankle/hip/knee grossly 4/5 except:  Right ankle/hip/knee grossly 4-/5 except:   Right ankle dorsiflexion 3+/5  Right hip ADD 3+/5   Right hip flexion 3-/5  LOWER EXTREMITY ROM:      Right ankle/hip/knee AROM grossly WFL except:   Right hip flexion 0-90 pain  Right hip IR 0-10*  PALPATION: Mod tenderness to palpation right hip bursae, gluteals  --------------------------------------------------------------------------------------------- TODAY'S TREATMENT:                                                                                                                              DATE:  06/06/23 NuStep Seat 4 L2 warm up [place] x 3" @ 60 SPM Seated   Leg curls with 30# 2x10   Leg press with 20#, PT minimal assist for ROM/ control Standing in the // bars   Hip abduction with 2#, bilateral, 2x10      06/02/23:   NuStep Seat 4 L2 warm up [place] x 5' 175ft with SPC with cueing for sequence, therapist adjusted cane height Sidelying: Clam with RTB 10x 5" Supine: Bridge with RTB around thigh 10X 5"  Standing: lumbar extension Seated: STS no HHA eccentric control 2x 5 reps   05/30/23 NuStep Seat 4 L2 warm up Supine  Hip abduction with B theraband 2x10  Bridge +  theraband abduction with B theraband- NT   Bilateral clamshells 2x10  05/26/23 - Hooklying Single Leg Bent Knee Fallouts with Resistance  x10 - Bridge with Abduction and Resistance Loop  x10   PATIENT EDUCATION:  Education details: HEP Person educated: Patient Education method: Explanation Education comprehension: verbalized  understanding  HOME EXERCISE PROGRAM: Access Code: [email or web address] URL: [email or web address] Date: 05/30/2023 Prepared by: Seymour Bars  Exercises - Hooklying Clamshell with Resistance  - 1 x daily - 2 sets - 10 reps - 3 second hold - Clamshell  - 1 x daily - 2 sets - 10 reps - 3 second hold - Beginner Bridge  - 1 x daily - 2 sets - 10 reps - 3 second  hold - Clamshell (Mirrored)  - 1 x daily - 2 sets - 10 reps - 3 second hold  06/02/23:  --------------------------------------------------------------------------------------------- ASSESSMENT:  CLINICAL IMPRESSION: Therapeutic exercise focus with lower extremity strengthening today. PT introduced lower extremity strength using resistance with machines and standing ankle weights. Patient able to tolerate session well with no increase in pain levels but with increased fatigue requiring intermittent rest breaks. Patient will continue to benefit from PT to return to PLOF   IE: Patient is a 62 y.o. y.o. female who was seen today for physical therapy evaluation and treatment for difficulty walking, lower extremity/ right leg weakness . Patient presents to PT with the following objective impairments: Abnormal gait, decreased activity tolerance, decreased endurance, difficulty walking, decreased ROM, decreased strength, postural dysfunction, and pain. These impairments limit the patient in activities such as carrying, lifting, bending, standing, squatting, stairs, transfers, locomotion level, and caring for others. These impairments also limit the patient in participation such as meal prep, cleaning, laundry, driving, shopping, community activity, and yard work. The patient will benefit from PT to address the limitations/impairments listed below to return to their prior level of function in the domains of activity and participation.    PERSONAL FACTORS: 1 comorbidity: PMH left CVA  are also affecting patient's functional outcome.   REHAB  POTENTIAL: Good  CLINICAL DECISION MAKING: Stable/uncomplicated  EVALUATION COMPLEXITY: Low  --------------------------------------------------------------------------------------------- GOALS: Goals reviewed with patient? No  SHORT TERM GOALS: Target date: 06/23/2023    Patient will be able to score a </= 48 on the Modified Oswestry    to demonstrate an improvement in overall housework, ADL completion, mobility, and self-care. Baseline: Goal status: INITIAL  2. Patient will be independent with a basic stretching/strengthening HEP  Baseline:  Goal status: INITIAL   LONG TERM GOALS: Target date: 07/21/2023   Patient will be able to score a </=42 on the Modified Oswestry    to demonstrate an improvement in overall housework, ADL completion, mobility, and self-care.Baseline:  Goal status: INITIAL  2.   Patient will complete the 5 times sit to stand:    within 15s  to demonstrate decreased falls risk Baseline:  Goal status: INITIAL  3.  Patient will be independent with a comprehensive strengthening HEP  Baseline:  Goal status: INITIAL  --------------------------------------------------------------------------------------------- PLAN:  PT FREQUENCY: 1-2x/week  PT DURATION: 8 weeks  PLANNED INTERVENTIONS: 97110-Therapeutic exercises, 97530- Therapeutic activity, 97112- Neuromuscular re-education, 97535- Self Care, [id number]- Manual therapy, [phone]- Gait training, Patient/Family education, Balance training, Stair training, Dry Needling, Joint mobilization, Joint manipulation, Spinal manipulation, Spinal mobilization, Vestibular training, DME instructions, Cryotherapy, and Moist heat.  PLAN FOR NEXT SESSION: Progress lower extremity strengthening     Seymour Bars, PT 06/06/2023, 2:48 PM

## 2023-06-08 ENCOUNTER — Ambulatory Visit (HOSPITAL_COMMUNITY): Payer: Medicare Other

## 2023-06-08 DIAGNOSIS — R29898 Other symptoms and signs involving the musculoskeletal system: Secondary | ICD-10-CM

## 2023-06-08 DIAGNOSIS — R262 Difficulty in walking, not elsewhere classified: Secondary | ICD-10-CM | POA: Diagnosis not present

## 2023-06-08 DIAGNOSIS — M6281 Muscle weakness (generalized): Secondary | ICD-10-CM

## 2023-06-08 DIAGNOSIS — R278 Other lack of coordination: Secondary | ICD-10-CM

## 2023-06-08 NOTE — Therapy (Signed)
[facility] OUTPATIENT PHYSICAL THERAPY TREATMENT (THORACOLUMBAR)   Patient Name: Meredith Weber MRN: [id number] DOB:[birth year], 62 y.o., female Today's Date: 06/08/2023  END OF SESSION:   PT End of Session - 06/08/23 1107     Visit Number 5    Number of Visits 10    Date for PT Re-Evaluation 06/23/23    Authorization Type Medicare Part A & B    Progress Note Due on Visit 10    PT Start Time 1103    PT Stop Time 1143    PT Time Calculation (min) 40 min    Activity Tolerance Patient tolerated treatment well;Patient limited by fatigue    Behavior During Therapy [facility] for tasks assessed/performed              Past Medical History:  Diagnosis Date   Spinal stenosis    Past Surgical History:  Procedure Laterality Date   BREAST CYST EXCISION Left 2010   CESAREAN SECTION  1984   NOVASURE ABLATION  2010   Patient Active Problem List   Diagnosis Date Noted   Microscopic hematuria 06/10/2021   Urge incontinence 06/10/2021   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   Strain of knee 07/23/2019   Benign essential hypertension 11/15/2014   Chest pain, atypical 11/15/2014   Abdominal wall hematoma 11/07/2012    PCP: Alvina Filbert, MDRef Provider (PCP)   REFERRING PROVIDER:   Oliver Barre, MD    REFERRING DIAG:  Diagnosis  M54.50 (ICD-10-CM) - Lumbar pain    Rationale for Evaluation and Treatment: Rehabilitation  THERAPY DIAG:  Difficulty in walking, not elsewhere classified  Right leg weakness  Other symptoms and signs involving the musculoskeletal system  Other lack of coordination  Muscle weakness (generalized)  ONSET DATE: 03/2021 left sided CVA --------------------------------------------------------------------------------------------- SUBJECTIVE:                                                                                                                                                                                            SUBJECTIVE STATEMENT: Today:  Patient with NPRS 6/10 pain for lumbar and Rt hip down to knee. Reports minimal soreness after last session  IE: Patient with left Sided CVA 2022; patient underwent rehab. Patient reports general weakness progressed over a year. It has gotten to a point that the patient is unable to stand without pain. Patient went to [facility] 10/23; MD prescribed prednisone and referred patient to OPPT.  PERTINENT HISTORY:  Left CVA 2022 HTN Spinal stenosis    PAIN:  Are you having pain? Yes: NPRS scale: 4/10 Pain location: lumbar into right hip/thigh Pain description: sharp Aggravating factors:  walking, standing  Relieving factors: rest  PRECAUTIONS: None  RED FLAGS: None   WEIGHT BEARING RESTRICTIONS: No  FALLS:  Has patient fallen in last 6 months? No  LIVING ENVIRONMENT: Lives with: lives with their family and lives with their spouse Lives in: House/apartment Stairs: Yes: External: 5 steps; can reach both Has following equipment at home: Single point cane and rollator How many hours do you sleep every night: 6-8 How many minutes of exercise do you get weekly: none other than ADLs  OCCUPATION: out of work  PLOF: Independent   PATIENT GOALS: to walk pain free   NEXT MD VISIT: Aug 12 2023 --------------------------------------------------------------------------------------------- OBJECTIVE:   DIAGNOSTIC FINDINGS:  Lumbar spine x-rays with 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, and stable anterolisthesis of L4-5   PATIENT SURVEYS:  Modified Oswestry Low Back Pain Disability Questionnaire: 26 / 50 = 52.0 %  SCREENING FOR RED FLAGS: Bowel or bladder incontinence: No Spinal tumors: No Cauda equina syndrome: No Compression fracture: No Abdominal aneurysm: No  COGNITION: Overall cognitive status: Within functional limits for tasks assessed  POSTURE: weight shift left      FUNCTIONAL TESTS:  5 times sit to stand:  18s with minimal unsteadiness  Age 10-69: 8.4  0.0 seconds for men, 12.7  1.8 seconds for women >16 seconds: The participant has a higher risk of falls   : 1109ft     GAIT ANALYSIS: Distance walked: 25 feet Assistive device utilized: None Level of assistance: SBA Comments: moderate antalgia on right lower extremity   SENSATION: WFL  TONE  +1 Ashworth Scale to right lower extremity    LOWER EXTREMITY MMT:    Left ankle/hip/knee grossly 4/5 except:  Right ankle/hip/knee grossly 4-/5 except:   Right ankle dorsiflexion 3+/5  Right hip ADD 3+/5   Right hip flexion 3-/5  LOWER EXTREMITY ROM:      Right ankle/hip/knee AROM grossly WFL except:   Right hip flexion 0-90 pain  Right hip IR 0-10*  PALPATION: Mod tenderness to palpation right hip bursae, gluteals  --------------------------------------------------------------------------------------------- TODAY'S TREATMENT:                                                                                                                              DATE:  06/08/23 NuStep Seat 4 L2 warm up [place] x 3" @ 60 SPM Seated   Leg curls with 40# 2x10   Hip marches with 2# 2x10 Leg press with 20#, PT minimal assist for ROM/ control Arm Bike 2'/2'  Standing  Tandem holds on 4" stairs, contact guard assist   Hip abduction using UE assistance with 2# 2x10  Step up + forward weight shift in // bars    06/06/23 NuStep Seat 4 L2 warm up [place] x 3" @ 60 SPM Seated   Leg curls with 30# 2x10   Leg press with 20#, PT minimal assist for ROM/ control Standing in the // bars  Hip abduction with 2#, bilateral, 2x10      06/02/23:   NuStep Seat 4 L2 warm up [place] x 5' 169ft with SPC with cueing for sequence, therapist adjusted cane height Sidelying: Clam with RTB 10x 5" Supine: Bridge with RTB around thigh 10X 5"  Standing: lumbar extension Seated: STS no HHA eccentric control 2x 5 reps    05/30/23 NuStep Seat 4 L2 warm up Supine  Hip abduction with B theraband 2x10  Bridge + theraband abduction with B theraband- NT   Bilateral clamshells 2x10  05/26/23 - Hooklying Single Leg Bent Knee Fallouts with Resistance  x10 - Bridge with Abduction and Resistance Loop  x10   PATIENT EDUCATION:  Education details: HEP Person educated: Patient Education method: Explanation Education comprehension: verbalized understanding  HOME EXERCISE PROGRAM: Access Code: [email or web address] URL: [email or web address] Date: 05/30/2023 Prepared by: Seymour Bars  Exercises - Hooklying Clamshell with Resistance  - 1 x daily - 2 sets - 10 reps - 3 second hold - Clamshell  - 1 x daily - 2 sets - 10 reps - 3 second hold - Beginner Bridge  - 1 x daily - 2 sets - 10 reps - 3 second  hold - Clamshell (Mirrored)  - 1 x daily - 2 sets - 10 reps - 3 second hold  06/02/23:  --------------------------------------------------------------------------------------------- ASSESSMENT:  CLINICAL IMPRESSION: Therapeutic exercise focus with lower extremity strengthening progression today. PT progressed lower extremity strength using resistance with machines and standing ankle weights. Patient able to tolerate session well with no increase in pain levels but with increased fatigue requiring intermittent rest breaks. Patient is able to resume therapeutic exercise after seated rest breaks ~30-45s Patient will continue to benefit from PT to return to PLOF   IE: Patient is a 62 y.o. y.o. female who was seen today for physical therapy evaluation and treatment for difficulty walking, lower extremity/ right leg weakness . Patient presents to PT with the following objective impairments: Abnormal gait, decreased activity tolerance, decreased endurance, difficulty walking, decreased ROM, decreased strength, postural dysfunction, and pain. These impairments limit the patient in activities such as carrying, lifting,  bending, standing, squatting, stairs, transfers, locomotion level, and caring for others. These impairments also limit the patient in participation such as meal prep, cleaning, laundry, driving, shopping, community activity, and yard work. The patient will benefit from PT to address the limitations/impairments listed below to return to their prior level of function in the domains of activity and participation.    PERSONAL FACTORS: 1 comorbidity: PMH left CVA  are also affecting patient's functional outcome.   REHAB POTENTIAL: Good  CLINICAL DECISION MAKING: Stable/uncomplicated  EVALUATION COMPLEXITY: Low  --------------------------------------------------------------------------------------------- GOALS: Goals reviewed with patient? No  SHORT TERM GOALS: Target date: 06/23/2023    Patient will be able to score a </= 48 on the Modified Oswestry    to demonstrate an improvement in overall housework, ADL completion, mobility, and self-care. Baseline: Goal status: INITIAL  2. Patient will be independent with a basic stretching/strengthening HEP  Baseline:  Goal status: INITIAL   LONG TERM GOALS: Target date: 07/21/2023   Patient will be able to score a </=42 on the Modified Oswestry    to demonstrate an improvement in overall housework, ADL completion, mobility, and self-care.Baseline:  Goal status: INITIAL  2.   Patient will complete the 5 times sit to stand:    within 15s  to demonstrate decreased falls risk Baseline:  Goal status: INITIAL  3.  Patient will be independent  with a comprehensive strengthening HEP  Baseline:  Goal status: INITIAL  --------------------------------------------------------------------------------------------- PLAN:  PT FREQUENCY: 1-2x/week  PT DURATION: 8 weeks  PLANNED INTERVENTIONS: 97110-Therapeutic exercises, 97530- Therapeutic activity, 97112- Neuromuscular re-education, 97535- Self Care, [id number]- Manual therapy, [phone]- Gait training,  Patient/Family education, Balance training, Stair training, Dry Needling, Joint mobilization, Joint manipulation, Spinal manipulation, Spinal mobilization, Vestibular training, DME instructions, Cryotherapy, and Moist heat.  PLAN FOR NEXT SESSION: Progress lower extremity strengthening     Seymour Bars, PT 06/08/2023, 11:07 AM

## 2023-06-14 ENCOUNTER — Ambulatory Visit (HOSPITAL_COMMUNITY): Payer: Medicare Other

## 2023-06-14 ENCOUNTER — Encounter (HOSPITAL_COMMUNITY): Payer: Self-pay

## 2023-06-14 DIAGNOSIS — R262 Difficulty in walking, not elsewhere classified: Secondary | ICD-10-CM | POA: Diagnosis not present

## 2023-06-14 DIAGNOSIS — R29898 Other symptoms and signs involving the musculoskeletal system: Secondary | ICD-10-CM

## 2023-06-14 DIAGNOSIS — M6281 Muscle weakness (generalized): Secondary | ICD-10-CM

## 2023-06-14 NOTE — Therapy (Signed)
[facility] OUTPATIENT PHYSICAL THERAPY TREATMENT (THORACOLUMBAR)   Patient Name: Meredith Weber MRN: [id number] DOB:January 07, 1961, 62 y.o., female Today's Date: 06/14/2023  END OF SESSION:   PT End of Session - 06/14/23 1059     Visit Number 6    Number of Visits 10    Date for PT Re-Evaluation 06/23/23    Authorization Type Medicare Part A & B    Progress Note Due on Visit 10    PT Start Time 1104    PT Stop Time 1145    PT Time Calculation (min) 41 min    Activity Tolerance Patient tolerated treatment well;Patient limited by fatigue    Behavior During Therapy [facility] for tasks assessed/performed              Past Medical History:  Diagnosis Date   Spinal stenosis    Past Surgical History:  Procedure Laterality Date   BREAST CYST EXCISION Left 2010   CESAREAN SECTION  1984   NOVASURE ABLATION  2010   Patient Active Problem List   Diagnosis Date Noted   Microscopic hematuria 06/10/2021   Urge incontinence 06/10/2021   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   Strain of knee 07/23/2019   Benign essential hypertension 11/15/2014   Chest pain, atypical 11/15/2014   Abdominal wall hematoma 11/07/2012    PCP: Alvina Filbert, MDRef Provider (PCP)   REFERRING PROVIDER:   Oliver Barre, MD    REFERRING DIAG:  Diagnosis  M54.50 (ICD-10-CM) - Lumbar pain    Rationale for Evaluation and Treatment: Rehabilitation  THERAPY DIAG:  Difficulty in walking, not elsewhere classified  Right leg weakness  Other symptoms and signs involving the musculoskeletal system  Muscle weakness (generalized)  ONSET DATE: 03/2021 left sided CVA --------------------------------------------------------------------------------------------- SUBJECTIVE:                                                                                                                                                                                           SUBJECTIVE STATEMENT: 06/14/23:   Pt stated pain scale range from 3-5/10 Rt lower back and hip down to knee.    IE: Patient with left Sided CVA 2022; patient underwent rehab. Patient reports general weakness progressed over a year. It has gotten to a point that the patient is unable to stand without pain. Patient went to Prthopedic MD 10/23; MD prescribed prednisone and referred patient to OPPT.  PERTINENT HISTORY:  Left CVA 2022 HTN Spinal stenosis    PAIN:  Are you having pain? Yes: NPRS scale: 3-5/10 Pain location: lumbar into right hip/thigh Pain description: sharp Aggravating factors: walking, standing  Relieving factors: rest  PRECAUTIONS: None  RED FLAGS: None   WEIGHT BEARING RESTRICTIONS: No  FALLS:  Has patient fallen in last 6 months? No  LIVING ENVIRONMENT: Lives with: lives with their family and lives with their spouse Lives in: House/apartment Stairs: Yes: External: 5 steps; can reach both Has following equipment at home: Single point cane and rollator How many hours do you sleep every night: 6-8 How many minutes of exercise do you get weekly: none other than ADLs  OCCUPATION: out of work  PLOF: Independent   PATIENT GOALS: to walk pain free   NEXT MD VISIT: Aug 12 2023 --------------------------------------------------------------------------------------------- OBJECTIVE:   DIAGNOSTIC FINDINGS:  Lumbar spine x-rays with 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, and stable anterolisthesis of L4-5   PATIENT SURVEYS:  Modified Oswestry Low Back Pain Disability Questionnaire: 26 / 50 = 52.0 %  SCREENING FOR RED FLAGS: Bowel or bladder incontinence: No Spinal tumors: No Cauda equina syndrome: No Compression fracture: No Abdominal aneurysm: No  COGNITION: Overall cognitive status: Within functional limits for tasks assessed  POSTURE: weight shift left      FUNCTIONAL TESTS:  5 times sit to stand: 18s with minimal unsteadiness  Age 12-69: 8.4  0.0 seconds for  men, 12.7  1.8 seconds for women >16 seconds: The participant has a higher risk of falls   : 141ft     GAIT ANALYSIS: Distance walked: 25 feet Assistive device utilized: None Level of assistance: SBA Comments: moderate antalgia on right lower extremity   SENSATION: WFL  TONE  +1 Ashworth Scale to right lower extremity    LOWER EXTREMITY MMT:    Left ankle/hip/knee grossly 4/5 except:  Right ankle/hip/knee grossly 4-/5 except:   Right ankle dorsiflexion 3+/5  Right hip ADD 3+/5   Right hip flexion 3-/5  LOWER EXTREMITY ROM:      Right ankle/hip/knee AROM grossly WFL except:   Right hip flexion 0-90 pain  Right hip IR 0-10*  PALPATION: Mod tenderness to palpation right hip bursae, gluteals  --------------------------------------------------------------------------------------------- TODAY'S TREATMENT:                                                                                                                              DATE: 06/14/23: Standing: 3D hip excursion (weight shift, rotate, STS, lumbar extension) Seated:  Leg press with 2Pl  (20#) 3x 10 with min A for ROM/control Rt LE  Large ball stretch 10x each direction with long hold last rep Standing:  Manual Lateral shifting with mirror infront  Leaning against wall to improve alignment  Vector stance with HHA 3x 5" with HHA  06/08/23 NuStep Seat 4 L2 warm up [place] x 3" @ 60 SPM Seated   Leg curls with 40# 2x10   Hip marches with 2# 2x10 Leg press with 20#, PT minimal assist for ROM/ control Arm Bike 2'/2'  Standing  Tandem holds on 4" stairs, contact guard assist   Hip abduction using UE assistance  with 2# 2x10  Step up + forward weight shift in // bars    06/06/23 NuStep Seat 4 L2 warm up [place] x 3" @ 60 SPM Seated   Leg curls with 30# 2x10   Leg press with 20#, PT minimal assist for ROM/ control Standing in the // bars   Hip abduction with 2#, bilateral,  2x10      06/02/23:   NuStep Seat 4 L2 warm up [place] x 5' 18ft with SPC with cueing for sequence, therapist adjusted cane height Sidelying: Clam with RTB 10x 5" Supine: Bridge with RTB around thigh 10X 5"  Standing: lumbar extension Seated: STS no HHA eccentric control 2x 5 reps   05/30/23 NuStep Seat 4 L2 warm up Supine  Hip abduction with B theraband 2x10  Bridge + theraband abduction with B theraband- NT   Bilateral clamshells 2x10  05/26/23 - Hooklying Single Leg Bent Knee Fallouts with Resistance  x10 - Bridge with Abduction and Resistance Loop  x10   PATIENT EDUCATION:  Education details: HEP Person educated: Patient Education method: Explanation Education comprehension: verbalized understanding  HOME EXERCISE PROGRAM: Access Code: [email or web address] URL: [email or web address] Date: 05/30/2023 Prepared by: Seymour Bars  Exercises - Hooklying Clamshell with Resistance  - 1 x daily - 2 sets - 10 reps - 3 second hold - Clamshell  - 1 x daily - 2 sets - 10 reps - 3 second hold - Beginner Bridge  - 1 x daily - 2 sets - 10 reps - 3 second  hold - Clamshell (Mirrored)  - 1 x daily - 2 sets - 10 reps - 3 second hold  06/02/23:  --------------------------------------------------------------------------------------------- ASSESSMENT:  CLINICAL IMPRESSION: Pt arrived with antalgic gait mechanics with decrease stance phase on Rt LE.  Began session with additional hip mobility exercises, reports increased pain with Rt LE weight bearing, presents with improved tolerance at EOS.  Continued with LE strengthening exercises that was tolerated well with no reports of increased pain.  Pt did require 1 rest break due to fatigue through session.    IE: Patient is a 62 y.o. y.o. female who was seen today for physical therapy evaluation and treatment for difficulty walking, lower extremity/ right leg weakness . Patient presents to PT with the following objective  impairments: Abnormal gait, decreased activity tolerance, decreased endurance, difficulty walking, decreased ROM, decreased strength, postural dysfunction, and pain. These impairments limit the patient in activities such as carrying, lifting, bending, standing, squatting, stairs, transfers, locomotion level, and caring for others. These impairments also limit the patient in participation such as meal prep, cleaning, laundry, driving, shopping, community activity, and yard work. The patient will benefit from PT to address the limitations/impairments listed below to return to their prior level of function in the domains of activity and participation.    PERSONAL FACTORS: 1 comorbidity: PMH left CVA  are also affecting patient's functional outcome.   REHAB POTENTIAL: Good  CLINICAL DECISION MAKING: Stable/uncomplicated  EVALUATION COMPLEXITY: Low  --------------------------------------------------------------------------------------------- GOALS: Goals reviewed with patient? No  SHORT TERM GOALS: Target date: 06/23/2023    Patient will be able to score a </= 48 on the Modified Oswestry    to demonstrate an improvement in overall housework, ADL completion, mobility, and self-care. Baseline: Goal status: INITIAL  2. Patient will be independent with a basic stretching/strengthening HEP  Baseline:  Goal status: INITIAL   LONG TERM GOALS: Target date: 07/21/2023   Patient will be able to score a </=42 on the Modified  Oswestry    to demonstrate an improvement in overall housework, ADL completion, mobility, and self-care.Baseline:  Goal status: INITIAL  2.   Patient will complete the 5 times sit to stand:    within 15s  to demonstrate decreased falls risk Baseline:  Goal status: INITIAL  3.  Patient will be independent with a comprehensive strengthening HEP  Baseline:  Goal status:  INITIAL  --------------------------------------------------------------------------------------------- PLAN:  PT FREQUENCY: 1-2x/week  PT DURATION: 8 weeks  PLANNED INTERVENTIONS: 97110-Therapeutic exercises, 97530- Therapeutic activity, 97112- Neuromuscular re-education, 97535- Self Care, 16109- Manual therapy, [phone]- Gait training, Patient/Family education, Balance training, Stair training, Dry Needling, Joint mobilization, Joint manipulation, Spinal manipulation, Spinal mobilization, Vestibular training, DME instructions, Cryotherapy, and Moist heat.  PLAN FOR NEXT SESSION: Progress lower extremity strengthening    Becky Sax, LPTA/CLT; CBIS [phone]  Juel Burrow, PTA 06/14/2023, 12:57 PM

## 2023-06-21 ENCOUNTER — Encounter (HOSPITAL_COMMUNITY): Payer: Medicare Other

## 2023-06-23 ENCOUNTER — Ambulatory Visit (HOSPITAL_COMMUNITY): Payer: Medicare Other | Attending: Orthopedic Surgery

## 2023-06-23 DIAGNOSIS — R29898 Other symptoms and signs involving the musculoskeletal system: Secondary | ICD-10-CM | POA: Diagnosis present

## 2023-06-23 DIAGNOSIS — R278 Other lack of coordination: Secondary | ICD-10-CM | POA: Insufficient documentation

## 2023-06-23 DIAGNOSIS — R262 Difficulty in walking, not elsewhere classified: Secondary | ICD-10-CM | POA: Diagnosis present

## 2023-06-23 DIAGNOSIS — M6281 Muscle weakness (generalized): Secondary | ICD-10-CM | POA: Diagnosis present

## 2023-06-23 NOTE — Therapy (Signed)
[facility] OUTPATIENT PHYSICAL THERAPY TREATMENT (THORACOLUMBAR)   Patient Name: Meredith Weber MRN: [id number] DOB:02-Sep-1960, 62 y.o., female Today's Date: 06/23/2023  END OF SESSION:   PT End of Session - 06/23/23 1103     Visit Number 7    Number of Visits 10    Date for PT Re-Evaluation 06/23/23    Authorization Type Medicare Part A & B    Progress Note Due on Visit 10    PT Start Time 1100    PT Stop Time 1140    PT Time Calculation (min) 40 min    Activity Tolerance Patient tolerated treatment well;Patient limited by fatigue    Behavior During Therapy [facility] for tasks assessed/performed              Past Medical History:  Diagnosis Date   Spinal stenosis    Past Surgical History:  Procedure Laterality Date   BREAST CYST EXCISION Left 2010   CESAREAN SECTION  1984   NOVASURE ABLATION  2010   Patient Active Problem List   Diagnosis Date Noted   Microscopic hematuria 06/10/2021   Urge incontinence 06/10/2021   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   Strain of knee 07/23/2019   Benign essential hypertension 11/15/2014   Chest pain, atypical 11/15/2014   Abdominal wall hematoma 11/07/2012    PCP: Alvina Filbert, MDRef Provider (PCP)   REFERRING PROVIDER:   Oliver Barre, MD    REFERRING DIAG:  Diagnosis  M54.50 (ICD-10-CM) - Lumbar pain    Rationale for Evaluation and Treatment: Rehabilitation  THERAPY DIAG:  Difficulty in walking, not elsewhere classified  Right leg weakness  Other symptoms and signs involving the musculoskeletal system  Muscle weakness (generalized)  Other lack of coordination  ONSET DATE: 03/2021 left sided CVA --------------------------------------------------------------------------------------------- SUBJECTIVE:                                                                                                                                                                                            SUBJECTIVE STATEMENT: 06/14/23:  Pt stated pain scale range from 4/10 right hip/ lower extremity; took Tylenol today  IE: Patient with left Sided CVA 2022; patient underwent rehab. Patient reports general weakness progressed over a year. It has gotten to a point that the patient is unable to stand without pain. Patient went to Prthopedic MD 10/23; MD prescribed prednisone and referred patient to OPPT.  PERTINENT HISTORY:  Left CVA 2022 HTN Spinal stenosis    PAIN:  Are you having pain? Yes: NPRS scale: 3-5/10 Pain location: lumbar into right hip/thigh Pain description: sharp Aggravating factors: walking, standing  Relieving factors:  rest  PRECAUTIONS: None  RED FLAGS: None   WEIGHT BEARING RESTRICTIONS: No  FALLS:  Has patient fallen in last 6 months? No  LIVING ENVIRONMENT: Lives with: lives with their family and lives with their spouse Lives in: House/apartment Stairs: Yes: External: 5 steps; can reach both Has following equipment at home: Single point cane and rollator How many hours do you sleep every night: 6-8 How many minutes of exercise do you get weekly: none other than ADLs  OCCUPATION: out of work  PLOF: Independent   PATIENT GOALS: to walk pain free   NEXT MD VISIT: Aug 12 2023 --------------------------------------------------------------------------------------------- OBJECTIVE:   DIAGNOSTIC FINDINGS:  Lumbar spine x-rays with 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, and stable anterolisthesis of L4-5   PATIENT SURVEYS:  Modified Oswestry Low Back Pain Disability Questionnaire: 26 / 50 = 52.0 %  SCREENING FOR RED FLAGS: Bowel or bladder incontinence: No Spinal tumors: No Cauda equina syndrome: No Compression fracture: No Abdominal aneurysm: No  COGNITION: Overall cognitive status: Within functional limits for tasks assessed  POSTURE: weight shift left      FUNCTIONAL TESTS:  5 times sit to stand: 18s with minimal  unsteadiness  Age 46-69: 8.4  0.0 seconds for men, 12.7  1.8 seconds for women >16 seconds: The participant has a higher risk of falls   : 142ft     GAIT ANALYSIS: Distance walked: 25 feet Assistive device utilized: None Level of assistance: SBA Comments: moderate antalgia on right lower extremity   SENSATION: WFL  TONE  +1 Ashworth Scale to right lower extremity    LOWER EXTREMITY MMT:    Left ankle/hip/knee grossly 4/5 except:  Right ankle/hip/knee grossly 4-/5 except:   Right ankle dorsiflexion 3+/5  Right hip ADD 3+/5   Right hip flexion 3-/5  LOWER EXTREMITY ROM:      Right ankle/hip/knee AROM grossly WFL except:   Right hip flexion 0-90 pain  Right hip IR 0-10*  PALPATION: Mod tenderness to palpation right hip bursae, gluteals  --------------------------------------------------------------------------------------------- TODAY'S TREATMENT:                                                                                                                              DATE:  06/08/23 NuStep  Seat 4;  28m03s for 1x lap @ Level 2 Standing in the // bars  Hip abduction, flexion with 2# 2x10   Forward step ups 4" step with 2#  Tandem stance + wt shifts, x10 each side  Seated  Large ball stretch x10  Arm bike forward/retro 1'/1'    06/14/23: Standing: 3D hip excursion (weight shift, rotate, STS, lumbar extension) Seated:  Leg press with 2Pl  (20#) 3x 10 with min A for ROM/control Rt LE  Large ball stretch 10x each direction with long hold last rep Standing:  Manual Lateral shifting with mirror infront  Leaning against wall to improve alignment  Vector stance with HHA 3x 5" with HHA  06/08/23 NuStep Seat 4 L2 warm up [place] x 3" @ 60 SPM Seated   Leg curls with 40# 2x10   Hip marches with 2# 2x10 Leg press with 20#, PT minimal assist for ROM/ control Arm Bike 2'/2'  Standing  Tandem holds on 4" stairs, contact guard assist   Hip  abduction using UE assistance with 2# 2x10  Step up + forward weight shift in // bars    06/06/23 NuStep Seat 4 L2 warm up [place] x 3" @ 60 SPM Seated   Leg curls with 30# 2x10   Leg press with 20#, PT minimal assist for ROM/ control Standing in the // bars   Hip abduction with 2#, bilateral, 2x10      PATIENT EDUCATION:  Education details: HEP Person educated: Patient Education method: Explanation Education comprehension: verbalized understanding  HOME EXERCISE PROGRAM: Access Code: [email or web address] URL: [email or web address] Date: 05/30/2023 Prepared by: Seymour Bars  Exercises - Hooklying Clamshell with Resistance  - 1 x daily - 2 sets - 10 reps - 3 second hold - Clamshell  - 1 x daily - 2 sets - 10 reps - 3 second hold - Beginner Bridge  - 1 x daily - 2 sets - 10 reps - 3 second  hold - Clamshell (Mirrored)  - 1 x daily - 2 sets - 10 reps - 3 second hold  06/02/23:  --------------------------------------------------------------------------------------------- ASSESSMENT:  CLINICAL IMPRESSION: Patient arrives to clinic without single point cane. Continued with LE strengthening exercises that was tolerated well with no reports of increased pain.  Pt did require intermittent seated rest breaks due to fatigue. Patient will continue to benefit from PT to return to prior level of function.   IE: Patient is a 62 y.o. y.o. female who was seen today for physical therapy evaluation and treatment for difficulty walking, lower extremity/ right leg weakness . Patient presents to PT with the following objective impairments: Abnormal gait, decreased activity tolerance, decreased endurance, difficulty walking, decreased ROM, decreased strength, postural dysfunction, and pain. These impairments limit the patient in activities such as carrying, lifting, bending, standing, squatting, stairs, transfers, locomotion level, and caring for others. These impairments also limit the  patient in participation such as meal prep, cleaning, laundry, driving, shopping, community activity, and yard work. The patient will benefit from PT to address the limitations/impairments listed below to return to their prior level of function in the domains of activity and participation.    PERSONAL FACTORS: 1 comorbidity: PMH left CVA  are also affecting patient's functional outcome.   REHAB POTENTIAL: Good  CLINICAL DECISION MAKING: Stable/uncomplicated  EVALUATION COMPLEXITY: Low  --------------------------------------------------------------------------------------------- GOALS: Goals reviewed with patient? No  SHORT TERM GOALS: Target date: 06/23/2023    Patient will be able to score a </= 48 on the Modified Oswestry    to demonstrate an improvement in overall housework, ADL completion, mobility, and self-care. Baseline: Goal status: INITIAL  2. Patient will be independent with a basic stretching/strengthening HEP  Baseline:  Goal status: INITIAL   LONG TERM GOALS: Target date: 07/21/2023   Patient will be able to score a </=42 on the Modified Oswestry    to demonstrate an improvement in overall housework, ADL completion, mobility, and self-care.Baseline:  Goal status: INITIAL  2.   Patient will complete the 5 times sit to stand:    within 15s  to demonstrate decreased falls risk Baseline:  Goal status: INITIAL  3.  Patient will be independent with a comprehensive strengthening HEP  Baseline:  Goal status: INITIAL  --------------------------------------------------------------------------------------------- PLAN:  PT FREQUENCY: 1-2x/week  PT DURATION: 8 weeks  PLANNED INTERVENTIONS: 97110-Therapeutic exercises, 97530- Therapeutic activity, 97112- Neuromuscular re-education, 97535- Self Care, 16109- Manual therapy, [phone]- Gait training, Patient/Family education, Balance training, Stair training, Dry Needling, Joint mobilization, Joint manipulation, Spinal  manipulation, Spinal mobilization, Vestibular training, DME instructions, Cryotherapy, and Moist heat.  PLAN FOR NEXT SESSION: Progress lower extremity strengthening    Becky Sax, LPTA/CLT; CBIS [phone]  Seymour Bars, PT 06/23/2023, 11:04 AM

## 2023-06-28 ENCOUNTER — Ambulatory Visit (HOSPITAL_COMMUNITY): Payer: Medicare Other

## 2023-06-28 DIAGNOSIS — R29898 Other symptoms and signs involving the musculoskeletal system: Secondary | ICD-10-CM

## 2023-06-28 DIAGNOSIS — R278 Other lack of coordination: Secondary | ICD-10-CM

## 2023-06-28 DIAGNOSIS — M6281 Muscle weakness (generalized): Secondary | ICD-10-CM

## 2023-06-28 DIAGNOSIS — R262 Difficulty in walking, not elsewhere classified: Secondary | ICD-10-CM

## 2023-06-28 NOTE — Therapy (Addendum)
[facility] OUTPATIENT PHYSICAL THERAPY TREATMENT (THORACOLUMBAR)   Patient Name: Meredith Weber MRN: [id number] DOB:1960/08/30, 63 y.o., female Today's Date: 06/28/2023  END OF SESSION:   PT End of Session - 06/28/23 1307     Visit Number 8    Number of Visits 10    Date for PT Re-Evaluation 06/23/23    Authorization Type Medicare Part A & B    Progress Note Due on Visit 10    PT Start Time 0100    PT Stop Time 0140    PT Time Calculation (min) 40 min    Activity Tolerance Patient tolerated treatment well;Patient limited by fatigue    Behavior During Therapy [facility] for tasks assessed/performed              Past Medical History:  Diagnosis Date   Spinal stenosis    Past Surgical History:  Procedure Laterality Date   BREAST CYST EXCISION Left 2010   CESAREAN SECTION  1984   NOVASURE ABLATION  2010   Patient Active Problem List   Diagnosis Date Noted   Microscopic hematuria 06/10/2021   Urge incontinence 06/10/2021   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   Strain of knee 07/23/2019   Benign essential hypertension 11/15/2014   Chest pain, atypical 11/15/2014   Abdominal wall hematoma 11/07/2012    PCP: Alvina Filbert, MDRef Provider (PCP)   REFERRING PROVIDER:   Oliver Barre, MD    REFERRING DIAG:  Diagnosis  M54.50 (ICD-10-CM) - Lumbar pain    Rationale for Evaluation and Treatment: Rehabilitation  THERAPY DIAG:  Difficulty in walking, not elsewhere classified  Right leg weakness  Other symptoms and signs involving the musculoskeletal system  Muscle weakness (generalized)  Other lack of coordination  ONSET DATE: 03/2021 left sided CVA [right sided weakness]  --------------------------------------------------------------------------------------------- SUBJECTIVE:                                                                                                                                                                                            SUBJECTIVE STATEMENT: Today: Patient was christmas shopping; reports using shopping buggy to get around. 5/10 in right lower extremity   IE: Patient with left Sided CVA 2022; patient underwent rehab. Patient reports general weakness progressed over a year. It has gotten to a point that the patient is unable to stand without pain. Patient went to [facility] 10/23; MD prescribed prednisone and referred patient to OPPT.  PERTINENT HISTORY:  Left CVA 2022 HTN Spinal stenosis    PAIN:  Are you having pain? Yes: NPRS scale: 3-5/10 Pain location: lumbar into right hip/thigh Pain description: sharp Aggravating  factors: walking, standing  Relieving factors: rest  PRECAUTIONS: None  RED FLAGS: None   WEIGHT BEARING RESTRICTIONS: No  FALLS:  Has patient fallen in last 6 months? No  LIVING ENVIRONMENT: Lives with: lives with their family and lives with their spouse Lives in: House/apartment Stairs: Yes: External: 5 steps; can reach both Has following equipment at home: Single point cane and rollator How many hours do you sleep every night: 6-8 How many minutes of exercise do you get weekly: none other than ADLs  OCCUPATION: out of work  PLOF: Independent   PATIENT GOALS: to walk pain free   NEXT MD VISIT: Aug 12 2023 --------------------------------------------------------------------------------------------- OBJECTIVE:   DIAGNOSTIC FINDINGS:  Lumbar spine x-rays with 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, and stable anterolisthesis of L4-5   PATIENT SURVEYS:  Modified Oswestry Low Back Pain Disability Questionnaire: 26 / 50 = 52.0 %  SCREENING FOR RED FLAGS: Bowel or bladder incontinence: No Spinal tumors: No Cauda equina syndrome: No Compression fracture: No Abdominal aneurysm: No  COGNITION: Overall cognitive status: Within functional limits for tasks assessed  POSTURE: weight shift left      FUNCTIONAL TESTS:  5  times sit to stand: 18s with minimal unsteadiness  Age 55-69: 8.4  0.0 seconds for men, 12.7  1.8 seconds for women >16 seconds: The participant has a higher risk of falls   : 165ft     GAIT ANALYSIS: Distance walked: 25 feet Assistive device utilized: None Level of assistance: SBA Comments: moderate antalgia on right lower extremity   SENSATION: WFL  TONE  +1 Ashworth Scale to right lower extremity    LOWER EXTREMITY MMT:    Left ankle/hip/knee grossly 4/5 except:  Right ankle/hip/knee grossly 4-/5 except:   Right ankle dorsiflexion 3+/5  Right hip ADD 3+/5   Right hip flexion 3-/5  LOWER EXTREMITY ROM:      Right ankle/hip/knee AROM grossly WFL except:   Right hip flexion 0-90 pain  Right hip IR 0-10*  PALPATION: Mod tenderness to palpation right hip bursae, gluteals  --------------------------------------------------------------------------------------------- TODAY'S TREATMENT:                                                                                                                              DATE:  06/28/23 Supine  LTRs with G physioball x10   Ball squeeze 10x3"H  SAQs with 3# 2x10  Hamstring stretch with strap 3x10"H  Seated   Leg curls with 50# x10  Single leg leg curl 20# 2x10 Standing  Hip abduction with 2# 2x10 in // bars        06/08/23 NuStep  Seat 4;  67m03s for 1x lap @ Level 2 Standing in the // bars  Hip abduction, flexion with 2# 2x10   Forward step ups 4" step with 2#  Tandem stance + wt shifts, x10 each side  Seated  Large ball stretch x10  Arm bike forward/retro 1'/1'    06/14/23:  Standing: 3D hip excursion (weight shift, rotate, STS, lumbar extension) Seated:  Leg press with 2Pl  (20#) 3x 10 with min A for ROM/control Rt LE  Large ball stretch 10x each direction with long hold last rep Standing:  Manual Lateral shifting with mirror infront  Leaning against wall to improve alignment  Vector stance with HHA 3x  5" with HHA  06/08/23 NuStep Seat 4 L2 warm up [place] x 3" @ 60 SPM Seated   Leg curls with 40# 2x10   Hip marches with 2# 2x10 Leg press with 20#, PT minimal assist for ROM/ control Arm Bike 2'/2'  Standing  Tandem holds on 4" stairs, contact guard assist   Hip abduction using UE assistance with 2# 2x10  Step up + forward weight shift in // bars    PATIENT EDUCATION:  Education details: HEP Person educated: Patient Education method: Explanation Education comprehension: verbalized understanding  HOME EXERCISE PROGRAM: Access Code: [email or web address] URL: [email or web address] Date: 05/30/2023 Prepared by: Seymour Bars  Exercises - Hooklying Clamshell with Resistance  - 1 x daily - 2 sets - 10 reps - 3 second hold - Clamshell  - 1 x daily - 2 sets - 10 reps - 3 second hold - Beginner Bridge  - 1 x daily - 2 sets - 10 reps - 3 second  hold - Clamshell (Mirrored)  - 1 x daily - 2 sets - 10 reps - 3 second hold  06/02/23:  --------------------------------------------------------------------------------------------- ASSESSMENT:  CLINICAL IMPRESSION: Patient arrives to clinic without single point cane. Continued with LE strengthening exercises that was tolerated well with no reports of increased pain.  PT trialed added supine therapeutic exercise that may become pt's HEP soon. Patient tolerated with no increase pain   Patient will continue to benefit from PT to return to prior level of function.   IE: Patient is a 62 y.o. y.o. female who was seen today for physical therapy evaluation and treatment for difficulty walking, lower extremity/ right leg weakness . Patient presents to PT with the following objective impairments: Abnormal gait, decreased activity tolerance, decreased endurance, difficulty walking, decreased ROM, decreased strength, postural dysfunction, and pain. These impairments limit the patient in activities such as carrying, lifting, bending, standing,  squatting, stairs, transfers, locomotion level, and caring for others. These impairments also limit the patient in participation such as meal prep, cleaning, laundry, driving, shopping, community activity, and yard work. The patient will benefit from PT to address the limitations/impairments listed below to return to their prior level of function in the domains of activity and participation.    PERSONAL FACTORS: 1 comorbidity: PMH left CVA  are also affecting patient's functional outcome.   REHAB POTENTIAL: Good  CLINICAL DECISION MAKING: Stable/uncomplicated  EVALUATION COMPLEXITY: Low  --------------------------------------------------------------------------------------------- GOALS: Goals reviewed with patient? No  SHORT TERM GOALS: Target date: 06/23/2023    Patient will be able to score a </= 48 on the Modified Oswestry    to demonstrate an improvement in overall housework, ADL completion, mobility, and self-care. Baseline: Goal status: INITIAL  2. Patient will be independent with a basic stretching/strengthening HEP  Baseline:  Goal status: INITIAL   LONG TERM GOALS: Target date: 07/21/2023   Patient will be able to score a </=42 on the Modified Oswestry    to demonstrate an improvement in overall housework, ADL completion, mobility, and self-care.Baseline:  Goal status: INITIAL  2.   Patient will complete the 5 times sit to stand:    within 15s  to demonstrate  decreased falls risk Baseline:  Goal status: INITIAL  3.  Patient will be independent with a comprehensive strengthening HEP  Baseline:  Goal status: INITIAL  --------------------------------------------------------------------------------------------- PLAN:  PT FREQUENCY: 1-2x/week  PT DURATION: 8 weeks  PLANNED INTERVENTIONS: 97110-Therapeutic exercises, 97530- Therapeutic activity, 97112- Neuromuscular re-education, 97535- Self Care, 95284- Manual therapy, [phone]- Gait training, Patient/Family education,  Balance training, Stair training, Dry Needling, Joint mobilization, Joint manipulation, Spinal manipulation, Spinal mobilization, Vestibular training, DME instructions, Cryotherapy, and Moist heat.  PLAN FOR NEXT SESSION: Progress lower extremity strengthening   Seymour Bars, PT 06/28/2023, 1:08 PM

## 2023-07-05 ENCOUNTER — Ambulatory Visit (HOSPITAL_COMMUNITY): Payer: Medicare Other

## 2023-07-05 DIAGNOSIS — M6281 Muscle weakness (generalized): Secondary | ICD-10-CM

## 2023-07-05 DIAGNOSIS — R29898 Other symptoms and signs involving the musculoskeletal system: Secondary | ICD-10-CM

## 2023-07-05 DIAGNOSIS — R262 Difficulty in walking, not elsewhere classified: Secondary | ICD-10-CM

## 2023-07-05 NOTE — Therapy (Signed)
[facility] OUTPATIENT PHYSICAL THERAPY TREATMENT (THORACOLUMBAR)   PHYSICAL THERAPY DISCHARGE SUMMARY  Visits from Start of Care: 9  Current functional level related to goals / functional outcomes: See below   Remaining deficits: See below   Education / Equipment: N/a   Patient agrees to discharge. Patient goals were met. Patient is being discharged due to meeting the stated rehab goals.   Patient Name: Meredith Weber MRN: [id number] DOB:Mar 16, 1961, 62 y.o., female Today's Date: 07/05/2023  END OF SESSION:   PT End of Session - 07/05/23 1304     Visit Number 9    Number of Visits 10    Date for PT Re-Evaluation 06/23/23    Authorization Type Medicare Part A & B    Progress Note Due on Visit 10    PT Start Time 0100    PT Stop Time 0140    PT Time Calculation (min) 40 min    Activity Tolerance Patient tolerated treatment well;Patient limited by fatigue    Behavior During Therapy [facility] for tasks assessed/performed              Past Medical History:  Diagnosis Date   Spinal stenosis    Past Surgical History:  Procedure Laterality Date   BREAST CYST EXCISION Left 2010   CESAREAN SECTION  1984   NOVASURE ABLATION  2010   Patient Active Problem List   Diagnosis Date Noted   Microscopic hematuria 06/10/2021   Urge incontinence 06/10/2021   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   Strain of knee 07/23/2019   Benign essential hypertension 11/15/2014   Chest pain, atypical 11/15/2014   Abdominal wall hematoma 11/07/2012    PCP: Alvina Filbert, MDRef Provider (PCP)   REFERRING PROVIDER:   Oliver Barre, MD    REFERRING DIAG:  Diagnosis  M54.50 (ICD-10-CM) - Lumbar pain    Rationale for Evaluation and Treatment: Rehabilitation  THERAPY DIAG:  Difficulty in walking, not elsewhere classified  Right leg weakness  Other symptoms and signs involving the musculoskeletal system  Muscle weakness (generalized)  ONSET DATE: 03/2021  left sided CVA [right sided weakness]  --------------------------------------------------------------------------------------------- SUBJECTIVE:                                                                                                                                                                                           SUBJECTIVE STATEMENT: Today: Patient feels she is pleased with her current PT progress   IE: Patient with left Sided CVA 2022; patient underwent rehab. Patient reports general weakness progressed over a year. It has gotten to a point that the patient is unable to stand without pain.  Patient went to [facility] 10/23; MD prescribed prednisone and referred patient to OPPT.  PERTINENT HISTORY:  Left CVA 2022 HTN Spinal stenosis    PAIN:  Are you having pain? Yes: NPRS scale: 3-5/10 Pain location: lumbar into right hip/thigh Pain description: sharp Aggravating factors: walking, standing  Relieving factors: rest  PRECAUTIONS: None  RED FLAGS: None   WEIGHT BEARING RESTRICTIONS: No  FALLS:  Has patient fallen in last 6 months? No  LIVING ENVIRONMENT: Lives with: lives with their family and lives with their spouse Lives in: House/apartment Stairs: Yes: External: 5 steps; can reach both Has following equipment at home: Single point cane and rollator How many hours do you sleep every night: 6-8 How many minutes of exercise do you get weekly: none other than ADLs  OCCUPATION: out of work  PLOF: Independent   PATIENT GOALS: to walk pain free   NEXT MD VISIT: Aug 12 2023 --------------------------------------------------------------------------------------------- OBJECTIVE:   DIAGNOSTIC FINDINGS:  Lumbar spine x-rays with 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, and stable anterolisthesis of L4-5   PATIENT SURVEYS:  Modified Oswestry Low Back Pain Disability Questionnaire: 26 / 50 = 52.0 %  07/05/23 Modified Oswestry Low Back  Pain Disability Questionnaire:17 / 50 = 34.0 % SCREENING FOR RED FLAGS: Bowel or bladder incontinence: No Spinal tumors: No Cauda equina syndrome: No Compression fracture: No Abdominal aneurysm: No  COGNITION: Overall cognitive status: Within functional limits for tasks assessed  POSTURE: weight shift left      FUNCTIONAL TESTS:  5 times sit to stand: 18s with minimal unsteadiness  Age 53-69: 8.4  0.0 seconds for men, 12.7  1.8 seconds for women >16 seconds: The participant has a higher risk of falls   : 173ft   07/05/23  : 264 feet with no assistive device; stand-by assist 5 times sit to stand: 14.24s with minimal upper extremity push  Age 53-69: 8.4  0.0 seconds for men, 12.7  1.8 seconds for women >16 seconds: The participant has a higher risk of falls      GAIT ANALYSIS: Distance walked: 25 feet Assistive device utilized: None Level of assistance: SBA Comments: moderate antalgia on right lower extremity   SENSATION: WFL  TONE  +1 Ashworth Scale to right lower extremity    LOWER EXTREMITY MMT:    Left ankle/hip/knee grossly 4/5 except:  Right ankle/hip/knee grossly 4-/5 except:   Right ankle dorsiflexion 3+/5  Right hip ADD 3+/5   Right hip flexion 3-/5  LOWER EXTREMITY ROM:      Right ankle/hip/knee AROM grossly WFL except:   Right hip flexion 0-90 pain  Right hip IR 0-10*  PALPATION: Mod tenderness to palpation right hip bursae, gluteals  --------------------------------------------------------------------------------------------- TODAY'S TREATMENT:                                                                                                                              DATE:  07/05/23 PT Discharge   06/28/23  Supine  LTRs with G physioball x10   Ball squeeze 10x3"H  SAQs with 3# 2x10  Hamstring stretch with strap 3x10"H  Seated   Leg curls with 50# x10  Single leg leg curl 20# 2x10 Standing  Hip abduction with 2# 2x10 in  // bars        06/08/23 NuStep  Seat 4;  37m03s for 1x lap @ Level 2 Standing in the // bars  Hip abduction, flexion with 2# 2x10   Forward step ups 4" step with 2#  Tandem stance + wt shifts, x10 each side  Seated  Large ball stretch x10  Arm bike forward/retro 1'/1'    06/14/23: Standing: 3D hip excursion (weight shift, rotate, STS, lumbar extension) Seated:  Leg press with 2Pl  (20#) 3x 10 with min A for ROM/control Rt LE  Large ball stretch 10x each direction with long hold last rep Standing:  Manual Lateral shifting with mirror infront  Leaning against wall to improve alignment  Vector stance with HHA 3x 5" with HHA  06/08/23 NuStep Seat 4 L2 warm up [place] x 3" @ 60 SPM Seated   Leg curls with 40# 2x10   Hip marches with 2# 2x10 Leg press with 20#, PT minimal assist for ROM/ control Arm Bike 2'/2'  Standing  Tandem holds on 4" stairs, contact guard assist   Hip abduction using UE assistance with 2# 2x10  Step up + forward weight shift in // bars    PATIENT EDUCATION:  Education details: HEP Person educated: Patient Education method: Explanation Education comprehension: verbalized understanding  HOME EXERCISE PROGRAM: Access Code: [email or web address] URL: [email or web address] Date: 05/30/2023 Prepared by: Seymour Bars  Exercises - Hooklying Clamshell with Resistance  - 1 x daily - 2 sets - 10 reps - 3 second hold - Clamshell  - 1 x daily - 2 sets - 10 reps - 3 second hold - Beginner Bridge  - 1 x daily - 2 sets - 10 reps - 3 second  hold - Clamshell (Mirrored)  - 1 x daily - 2 sets - 10 reps - 3 second hold  06/02/23:  --------------------------------------------------------------------------------------------- ASSESSMENT:  CLINICAL IMPRESSION:  Patient has shown overall improvements in , 5TSTS, pain levels. Patient has completed (9) visits since the initial evaluation and has met 5/5 stated rehab goal. At this moment, PT  discharge patient to HEP due to meeting the stated rehab goals..    IE: Patient is a 62 y.o. y.o. female who was seen today for physical therapy evaluation and treatment for difficulty walking, lower extremity/ right leg weakness . Patient presents to PT with the following objective impairments: Abnormal gait, decreased activity tolerance, decreased endurance, difficulty walking, decreased ROM, decreased strength, postural dysfunction, and pain. These impairments limit the patient in activities such as carrying, lifting, bending, standing, squatting, stairs, transfers, locomotion level, and caring for others. These impairments also limit the patient in participation such as meal prep, cleaning, laundry, driving, shopping, community activity, and yard work. The patient will benefit from PT to address the limitations/impairments listed below to return to their prior level of function in the domains of activity and participation.    PERSONAL FACTORS: 1 comorbidity: PMH left CVA  are also affecting patient's functional outcome.   REHAB POTENTIAL: Good  CLINICAL DECISION MAKING: Stable/uncomplicated  EVALUATION COMPLEXITY: Low  --------------------------------------------------------------------------------------------- GOALS: Goals reviewed with patient? No  SHORT TERM GOALS: Target date: 06/23/2023    Patient will be able to score a </= 48 on the  Modified Oswestry    to demonstrate an improvement in overall housework, ADL completion, mobility, and self-care. Baseline: Goal status: goal met   2. Patient will be independent with a basic stretching/strengthening HEP  Baseline:  Goal status: goal met    LONG TERM GOALS: Target date: 07/21/2023   Patient will be able to score a </=42 on the Modified Oswestry    to demonstrate an improvement in overall housework, ADL completion, mobility, and self-care.Baseline:  Goal status: goal met   2.   Patient will complete the 5 times sit to stand:     within 15s  to demonstrate decreased falls risk Baseline:  Goal status: goal met   3.  Patient will be independent with a comprehensive strengthening HEP  Baseline:  Goal status: goal met   --------------------------------------------------------------------------------------------- PLAN:  PT FREQUENCY: 1-2x/week  PT DURATION: 8 weeks  PLANNED INTERVENTIONS: 97110-Therapeutic exercises, 97530- Therapeutic activity, 97112- Neuromuscular re-education, 97535- Self Care, 84132- Manual therapy, [phone]- Gait training, Patient/Family education, Balance training, Stair training, Dry Needling, Joint mobilization, Joint manipulation, Spinal manipulation, Spinal mobilization, Vestibular training, DME instructions, Cryotherapy, and Moist heat.  PLAN FOR NEXT SESSION: D/C  Seymour Bars, PT 07/05/2023, 1:06 PM

## 2023-08-12 ENCOUNTER — Ambulatory Visit: Payer: Medicare (Managed Care) | Admitting: Orthopedic Surgery

## 2023-08-16 ENCOUNTER — Encounter: Payer: Self-pay | Admitting: Orthopedic Surgery

## 2023-08-16 ENCOUNTER — Ambulatory Visit: Payer: Medicare (Managed Care) | Admitting: Orthopedic Surgery

## 2023-08-16 ENCOUNTER — Telehealth: Payer: Self-pay | Admitting: Orthopedic Surgery

## 2023-08-16 VITALS — BP 145/95 | HR 81 | Ht <= 58 in | Wt 191.0 lb

## 2023-08-16 DIAGNOSIS — M545 Low back pain, unspecified: Secondary | ICD-10-CM | POA: Diagnosis not present

## 2023-08-16 NOTE — Telephone Encounter (Signed)
Dr. Dallas Schimke pt - spoke w/the pt, she has scheduled her MRI for 08/26/23 and is requesting something for anxiety to be sent to [facility]

## 2023-08-16 NOTE — Addendum Note (Signed)
Addended by: Michaele Offer on: 08/16/2023 02:16 PM   Modules accepted: Orders

## 2023-08-16 NOTE — Progress Notes (Signed)
Return patient Visit  Assessment: Meredith Weber is a 63 y.o. female with the following: 1. Lumbar pain  Plan: Dacie R Skelly continues to have right-sided lower back pain, with some pain radiating into the right leg.  PT improved some of the symptoms, but she continues to have pain.  She has some anterolisthesis on x-rays.  Prior MRI demonstrates stenosis.  She has had injections in her back.  Given her treatments thus far, and her ongoing pain, would recommend an MRI.  Will obtain MRI of the lumbar spine, and see her back in clinic to discuss the findings.  In addition, we discussed occasional pain in the anterior hip, as well as tenderness and pain over the lateral hip.  She may benefit from a troches bursitis injection.  If she continues to have issues, we should consider obtaining x-rays of the right hip.   Follow-up: Return for After MRI.  Subjective:  Chief Complaint  Patient presents with   Back Pain    Fu lumbar pain     History of Present Illness: Meredith Weber is a 63 y.o. female who returns to clinic for repeat evaluation of low back pain.  She has had low back pain for years.  At the last visit, recommended prednisone, as well as physical therapy.  She has completed therapy, with some improvement in her symptoms.  She continues to have pain, with radiating pains into the right leg.  She denies numbness and tingling.  She also has occasional catching sensations in the anterior hip.  She also notes some tenderness over the lateral hip.  Review of Systems: No fevers or chills No numbness or tingling No chest pain No shortness of breath No bowel or bladder dysfunction No GI distress No headaches    Objective: BP (!) 145/95   Pulse 81   Ht 4\' 10"  (1.473 m)   Wt 191 lb (86.6 kg)   BMI 39.92 kg/m   Physical Exam:  General: Alert and oriented. and No acute distress. Gait: Right sided antalgic gait.  Tenderness along the lower back.  Negative straight leg raise  bilaterally.  She has good lower body strength.  Slightly restricted internal rotation of the right hip, compared to the left hip.  She does have tenderness to palpation of the greater trochanter laterally.  IMAGING: I personally ordered and reviewed the following images  No new imaging obtained today.  New Medications:  No orders of the defined types were placed in this encounter.     Oliver Barre, MD  08/16/2023 10:35 AM

## 2023-08-17 MED ORDER — DIAZEPAM 5 MG PO TABS: 5.0000 mg | ORAL_TABLET | Freq: Once | ORAL | 0 refills | Status: AC

## 2023-08-17 NOTE — Addendum Note (Signed)
Addended by: Thane Edu A on: 08/17/2023 11:58 AM   Modules accepted: Orders

## 2023-08-26 ENCOUNTER — Ambulatory Visit (HOSPITAL_COMMUNITY)
Admission: RE | Admit: 2023-08-26 | Discharge: 2023-08-26 | Disposition: A | Discharge status: Home / Self Care | Payer: Medicare (Managed Care) | Source: Ambulatory Visit | Attending: Orthopedic Surgery | Admitting: Orthopedic Surgery

## 2023-08-26 DIAGNOSIS — M545 Low back pain, unspecified: Secondary | ICD-10-CM | POA: Diagnosis present

## 2023-09-02 NOTE — Progress Notes (Signed)
Called to have images read for appointment.

## 2023-09-06 ENCOUNTER — Encounter: Payer: Self-pay | Admitting: Orthopedic Surgery

## 2023-09-06 ENCOUNTER — Ambulatory Visit: Payer: Medicare (Managed Care) | Admitting: Orthopedic Surgery

## 2023-09-06 DIAGNOSIS — M48061 Spinal stenosis, lumbar region without neurogenic claudication: Secondary | ICD-10-CM

## 2023-09-06 NOTE — Progress Notes (Signed)
Return patient Visit  Assessment: Meredith Weber is a 63 y.o. female with the following: 1. Lumbar pain  Plan: Charnae R Crutcher continues to have right-sided lower back pain, with some pain radiating into the right leg.  We have obtained an MRI, which demonstrates moderate to severe stenosis in the lower lumbar spine.  There has been some progression since her most recent MRI.  Anterolisthesis is stable.  She has had some improvement with therapy, but does not report resolution of her symptoms.  At this point, I am recommending a referral to a spine surgeon, and she would like to see Dr. Marcell Barlow in [place], as this is more convenient to her.  We will place the referral, and she will return to clinic as needed.   If she has worsening pain directly over the greater trochanter laterally, or within the anterior/groin area of the right hip, I am happy to see her for further evaluation of the right hip.   Follow-up: Return for For referral to Dr. Marcell Barlow.  Subjective:  Chief Complaint  Patient presents with   Back Pain    No changes, here for MRI results.     History of Present Illness: Meredith Weber is a 63 y.o. female who returns to clinic for repeat evaluation of low back pain.  She has had chronic low back pain, with radiating pains into the right leg.  Physical therapy has not come provide resolution of her symptoms.  She has obtained an MRI, and is here to discuss the findings.   Review of Systems: No fevers or chills No numbness or tingling No chest pain No shortness of breath No bowel or bladder dysfunction No GI distress No headaches    Objective: There were no vitals taken for this visit.  Physical Exam:  General: Alert and oriented. and No acute distress. Gait: Right sided antalgic gait.  Tenderness along the lower back.  Negative straight leg raise bilaterally.  She has good lower body strength.  Mild tenderness to palpation over the lateral hip.  This  is not the focal point of her pain.  She has limited internal rotation, but does not have pain.  Slightly less compared to the contralateral side.  She has pain in the right buttock with external rotation of the right hip.  2+ patellar tendon reflex.  Sensation intact distally.  IMAGING: I personally ordered and reviewed the following images  Lumbar spine MRI  IMPRESSION: 1. Progressive multilevel lumbar spondylosis as described above. Worsening severe spinal canal stenosis at L4-L5 and L5-S1. Unchanged severe right neuroforaminal stenosis at L5-S1.   New Medications:  No orders of the defined types were placed in this encounter.     Oliver Barre, MD  09/06/2023 11:32 AM

## 2023-09-14 NOTE — Progress Notes (Unsigned)
 Referring Physician:  Oliver Barre, MD [phone] [address] [place],  [place] [address]  Primary Physician:  Alvina Filbert, MD  History of Present Illness: 09/15/2023 [name] is here today with a chief complaint of back and right leg pain.  She has had pain for many years.  She originally had injections more than a decade ago.  Over the past 2 to 3 years, her symptoms have been slowly worsening.  She now has severe right sided lower back pain that extends into her right leg.  She has trouble with walking more than a short distance.  She feels like her right leg is going to give out.  She has trouble with climbing steps as well.  She can only walk 100 250 feet before she has to stop.  When she stops, her pain improves.   Bowel/Bladder Dysfunction: none  Conservative measures:  Physical therapy: has participated in PT at [facility] in 11-06/2023 Multimodal medical therapy including regular antiinflammatories:  Prednisone, Methocarbamol Injections: no recent epidural steroid injections  Past Surgery: none  [name] has no symptoms of cervical myelopathy.  The symptoms are causing a significant impact on the patient's life.   I have utilized the care everywhere function in epic to review the outside records available from external health systems.  Review of Systems:  A 10 point review of systems is negative, except for the pertinent positives and negatives detailed in the HPI.  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 (Patient taking differently: Place 1 spray into both nostrils daily. Buys over the counter), Disp: 16 g, Rfl: 0  Social History: Social History   Tobacco Use   Smoking status: Former    Current packs/day: 1.00    Average packs/day: 1 pack/day for 10.2 years (10.2 ttl pk-yrs)    Types: Cigarettes    Start date: 2015   Smokeless tobacco: Current  Substance Use Topics   Alcohol use: Yes   Drug use: No    Family Medical History: History reviewed. No pertinent family history.  Physical Examination: Vitals:   09/15/23 0904  BP: (!) 134/90    General: Patient is in no apparent distress. Attention to examination is appropriate.  Neck:   Supple.  Full range of motion.  Respiratory: Patient is breathing without any difficulty.   NEUROLOGICAL:     Awake, alert, oriented to person, place, and time.  Speech is clear and fluent.   Cranial Nerves: Pupils equal round and reactive to light.  Facial tone is symmetric.  Facial sensation is symmetric. Shoulder shrug is symmetric. Tongue protrusion is midline.  There is no pronator drift.  Strength: Side Biceps Triceps Deltoid Interossei Grip Wrist Ext. Wrist Flex.  R 5 5 5 5 5 5 5   L 5 5 5 5 5 5 5    Side Iliopsoas Quads Hamstring PF DF EHL  R 5 5 4 4 5 4   L 5 5 5 5 5 5    Reflexes are 2+ and symmetric at the biceps, triceps, brachioradialis, patella and achilles.   Hoffman's is absent.   Bilateral upper and lower extremity sensation is intact to light touch.    No evidence of dysmetria noted.  Gait is slowed and  antalgic.     Medical Decision Making  Imaging: MRI L spine 08/26/2023 Disc levels:   T11-T12: Mild disc bulging and facet arthropathy, progressed since prior study. No stenosis.   T12-L1:  Negative.   L1-L2:  Negative.   L2-L3: Mild disc bulging and bilateral facet arthropathy, progressed since the prior study. Mild spinal canal stenosis. No neuroforaminal stenosis.   L3-L4: Mild disc bulging and bilateral facet arthropathy, progressed since the prior study. No stenosis.   L4-L5: Similar mild disc bulging. Progressive severe bilateral facet arthropathy and ligamentum flavum hypertrophy. Worsening severe spinal canal stenosis. No neuroforaminal stenosis.   L5-S1: Progressive mild disc bulging with enlarging superimposed right subarticular disc protrusion. Progressive severe right and moderate left facet arthropathy. Worsened now severe spinal canal and right lateral recess stenosis. Unchanged severe right and mild left neuroforaminal stenosis.   IMPRESSION: 1. Progressive multilevel lumbar spondylosis as described above. Worsening severe spinal canal stenosis at L4-L5 and L5-S1. Unchanged severe right neuroforaminal stenosis at L5-S1.     Electronically Signed   By: Obie Dredge M.D.   On: 09/02/2023 14:16   Please note that I have compared her x-rays from October 2024 which show an 8 mm anterolisthesis of L4 on L5.  I have measured it on her MRI scan where it is 3 mm.  I have personally reviewed the images and agree with the above interpretation.  Assessment and Plan: [name] is a pleasant 63 y.o. female with back pain with right lower extremity sciatica.  She has weakness in her right leg.  She has tried and failed conservative management including physical therapy and medications.  Her symptoms have been worsening over many years.  She has evidence of lumbar spinal instability as she has 3 mm of anterolisthesis of L4 and L5 when she is supine but extends to 8  mm when she bears weight.  She has severe lumbar spinal stenosis at L4-5 and L5-S1.  She has extensive facet arthrosis at L5-S1.  She has tried and failed conservative management.  I have recommended surgical intervention.  I recommended L4-S1 minimally invasive transforaminal lumbar interbody fusion.  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  Additionally, she has severe overgrowth of the right L5-S1 facet joint.  Removal of this joint will be necessary to decompress the right L5 and S1 nerve roots.  I discussed the planned procedure at length with the patient, including the risks, benefits, alternatives, and indications. The risks discussed include but are not limited to bleeding, infection, need for reoperation, spinal fluid leak, stroke, vision loss, anesthetic complication, coma, paralysis, and even death. I also described in detail that improvement was not guaranteed.  The patient expressed  understanding of these risks. I described the surgery in layman's terms, and gave ample opportunity for questions, which were answered to the best of my ability.  She would like to talk this over with her husband and will return to discuss.  Please note that she has elevated risk for surgical procedure given her weakness, history of stroke, and obesity.  I spent a total of 30 minutes in this patient's care today. This time was spent reviewing pertinent records including imaging studies, obtaining and confirming history, performing a directed evaluation, formulating and discussing my recommendations, and documenting the visit within the medical record.     Thank you for involving me in the care of this patient.      Myha Arizpe K. Myer Haff MD, [facility] Neurosurgery

## 2023-09-15 ENCOUNTER — Encounter: Payer: Self-pay | Admitting: Neurosurgery

## 2023-09-15 ENCOUNTER — Telehealth: Payer: Self-pay | Admitting: Neurosurgery

## 2023-09-15 ENCOUNTER — Ambulatory Visit (INDEPENDENT_AMBULATORY_CARE_PROVIDER_SITE_OTHER): Payer: Medicare (Managed Care) | Admitting: Neurosurgery

## 2023-09-15 VITALS — BP 134/90 | Ht <= 58 in | Wt 192.0 lb

## 2023-09-15 DIAGNOSIS — M5441 Lumbago with sciatica, right side: Secondary | ICD-10-CM | POA: Diagnosis not present

## 2023-09-15 DIAGNOSIS — M532X6 Spinal instabilities, lumbar region: Secondary | ICD-10-CM

## 2023-09-15 DIAGNOSIS — G8929 Other chronic pain: Secondary | ICD-10-CM

## 2023-09-15 DIAGNOSIS — M48062 Spinal stenosis, lumbar region with neurogenic claudication: Secondary | ICD-10-CM

## 2023-09-15 DIAGNOSIS — M4316 Spondylolisthesis, lumbar region: Secondary | ICD-10-CM

## 2023-09-15 NOTE — Telephone Encounter (Signed)
 Patient is calling to let our office know that she would like to proceed with having surgery after 11/01/2023. She also would like an estimate of what the surgery would cost her out of pocket if possible.

## 2023-09-16 NOTE — Telephone Encounter (Signed)
 I spoke with Mrs Guilfoil. We discussed the information below. I provided her with the CPT codes so she can contact her insurance company for an estimate. I also informed her that the pre-service center should call her closer to her surgery date.  Planned surgery: L4-S1 minimally invasive (MIS) transforaminal lumbar interbody fusion (TLIF)   Surgery date: 11/02/23 at [facility] (Medical Mall: [address], [place], [place] [address]) - you will find out your arrival time the business day before your surgery.   Pre-op appointment at [facility] Pre-admit Testing: we will call you with a date/time for this. If you are scheduled for an in person appointment, Pre-admit Testing is located on the first floor of the [facility] building, [address], [address]. Please bring all prescriptions in the original prescription bottles to your appointment. During this appointment, they will advise you which medications you can take the morning of surgery, and which medications you will need to hold for surgery. Labs (such as blood work, EKG) may be done at your pre-op appointment. You are not required to fast for these labs. Should you need to change your pre-op appointment, please call Pre-admit testing at [phone].     NSAIDS (Non-steroidal anti-inflammatory drugs): because you are having a fusion, please avoid taking any NSAIDS (examples: ibuprofen, motrin, aleve, naproxen, meloxicam, diclofenac) for 3 months after surgery. Celebrex is an exception and is OK to take, if prescribed. Tylenol is not an NSAID.    Common restrictions after surgery: No bending, lifting, or twisting ("BLT"). Avoid lifting objects heavier than 10 pounds for the first 6 weeks after surgery. Where possible, avoid household activities that involve lifting, bending, reaching, pushing, or pulling such as laundry, vacuuming, grocery shopping, and childcare. Try to arrange for help from friends  and family for these activities while you heal. Do not drive while taking prescription pain medication. Weeks 6 through 12 after surgery: avoid lifting more than 25 pounds.    X-rays after surgery: Because you are having a fusion or arthroplasty: for appointments after your 2 week follow-up: please arrive at the [facility] outpatient imaging center ([address], [address], [place]) or [facility] one hour prior to your appointment for x-rays. This applies to every appointment after your 2 week follow-up. Failure to do so may result in your appointment being rescheduled.   How to contact us:  If you have any questions/concerns before or after surgery, you can reach [place] at [phone], or you can send a mychart message. We can be reached by phone or mychart 8am-4pm, Monday-Friday.  *Please note: Calls after 4pm are forwarded to a third party answering service. Mychart messages are not routinely monitored during evenings, weekends, and holidays. Please call our office to contact the answering service for urgent concerns during non-business hours.    If you have FMLA/disability paperwork, please drop it off or fax it to [phone], attention Patty.   Appointments/FMLA & disability paperwork: Joycelyn Rua, & Flonnie Hailstone Registered Nurse/Surgery scheduler: Royston Cowper Medical Assistants: Nash Mantis Physician Assistants: Joan Flores, PA-C, Manning Charity, PA-C & Drake Leach, PA-C Surgeons: Venetia Night, MD & Ernestine Mcmurray, MD

## 2023-09-19 ENCOUNTER — Other Ambulatory Visit: Payer: Self-pay

## 2023-09-19 DIAGNOSIS — Z01818 Encounter for other preprocedural examination: Secondary | ICD-10-CM

## 2023-09-29 ENCOUNTER — Ambulatory Visit (INDEPENDENT_AMBULATORY_CARE_PROVIDER_SITE_OTHER): Payer: Self-pay | Admitting: Neurosurgery

## 2023-09-29 ENCOUNTER — Encounter: Payer: Self-pay | Admitting: Neurosurgery

## 2023-09-29 VITALS — BP 136/88 | Ht <= 58 in | Wt 192.0 lb

## 2023-09-29 DIAGNOSIS — M48062 Spinal stenosis, lumbar region with neurogenic claudication: Secondary | ICD-10-CM

## 2023-09-29 DIAGNOSIS — M532X6 Spinal instabilities, lumbar region: Secondary | ICD-10-CM

## 2023-09-29 DIAGNOSIS — M4316 Spondylolisthesis, lumbar region: Secondary | ICD-10-CM | POA: Diagnosis not present

## 2023-09-29 DIAGNOSIS — M5441 Lumbago with sciatica, right side: Secondary | ICD-10-CM

## 2023-09-29 DIAGNOSIS — G8929 Other chronic pain: Secondary | ICD-10-CM

## 2023-09-29 NOTE — Progress Notes (Signed)
 Referring Physician:  Alvina Filbert, MD [address] [place],  [place] [address]  Primary Physician:  Alvina Filbert, MD  History of Present Illness: 09/29/2023 Meredith Weber presents today to discuss surgery.  Her husband is with her.  09/15/2023 Meredith Weber is here today with a chief complaint of back and right leg pain.  She has had pain for many years.  She originally had injections more than a decade ago.  Over the past 2 to 3 years, her symptoms have been slowly worsening.  She now has severe right sided lower back pain that extends into her right leg.  She has trouble with walking more than a short distance.  She feels like her right leg is going to give out.  She has trouble with climbing steps as well.  She can only walk 100 250 feet before she has to stop.  When she stops, her pain improves.   Bowel/Bladder Dysfunction: none  Conservative measures:  Physical therapy: has participated in PT at [facility] in 11-06/2023 Multimodal medical therapy including regular antiinflammatories:  Prednisone, Methocarbamol Injections: no recent epidural steroid injections  Past Surgery: none  Meredith Weber has no symptoms of cervical myelopathy.  The symptoms are causing a significant impact on the patient's life.   I have utilized the care everywhere function in epic to review the outside records available from external health systems.  Review of Systems:  A 10 point review of systems is negative, except for the pertinent positives and negatives detailed in the HPI.  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   Spinal stenosis     Past Surgical History: Past Surgical History:  Procedure Laterality Date   BREAST CYST EXCISION Left 07/19/2008   CESAREAN SECTION  07/19/1982   NOVASURE ABLATION  07/19/2008    ROTATOR CUFF REPAIR Right     Allergies: Allergies as of 09/29/2023 - Review Complete 09/29/2023  Allergen Reaction Noted   Cashew nut oil Anaphylaxis, Rash, and Other (See Comments) 04/08/2021    Medications:  Current Outpatient Medications:    amLODipine (NORVASC) 2.5 MG tablet, 1 tablet Orally Once a day for 30 days, Disp: , Rfl:    atorvastatin (LIPITOR) 20 MG tablet, Take 20 mg by mouth daily., Disp: , Rfl:    fluticasone (FLONASE) 50 MCG/ACT nasal spray, Place 1 spray into both nostrils daily for 14 days. (Patient taking differently: Place 1 spray into both nostrils daily. Buys over the counter), Disp: 16 g, Rfl: 0   hydrochlorothiazide (HYDRODIURIL) 12.5 MG tablet, 1 tablet in the morning for swelling Orally Once a day for 30 days, Disp: , Rfl:    ibuprofen (ADVIL) 200 MG tablet, Take by mouth., Disp: , Rfl:   Social History: Social History   Tobacco Use   Smoking status: Former    Types: Cigarettes   Smokeless tobacco: Never   Tobacco comments:    Quit 2015  Substance Use Topics   Alcohol use: Yes   Drug use: No    Family Medical History: History reviewed. No pertinent family history.  Physical Examination: Vitals:   09/29/23 1448  BP: 136/88    General: Patient is in no apparent distress. Attention to examination is appropriate.  Neck:   Supple.  Full range of motion.  Respiratory: Patient is breathing without any difficulty.  NEUROLOGICAL:     Awake, alert, oriented to person, place, and time.  Speech is clear and fluent.   Cranial Nerves: Pupils equal round and reactive to light.  Facial tone is symmetric.  Facial sensation is symmetric. Shoulder shrug is symmetric. Tongue protrusion is midline.  There is no pronator drift.  Strength:  Continued right lower extremity weakness consistent with exam below Side Biceps Triceps Deltoid Interossei Grip Wrist Ext. Wrist Flex.  R 5 5 5 5 5 5 5   L 5 5 5 5 5 5 5    Side Iliopsoas Quads Hamstring PF DF EHL   R 5 5 4 4 5 4   L 5 5 5 5 5 5    Reflexes are 2+ and symmetric at the biceps, triceps, brachioradialis, patella and achilles.   Hoffman's is absent.   Bilateral upper and lower extremity sensation is intact to light touch.    No evidence of dysmetria noted.  Gait is slowed and antalgic.     Medical Decision Making  Imaging: MRI L spine 08/26/2023 Disc levels:   T11-T12: Mild disc bulging and facet arthropathy, progressed since prior study. No stenosis.   T12-L1:  Negative.   L1-L2:  Negative.   L2-L3: Mild disc bulging and bilateral facet arthropathy, progressed since the prior study. Mild spinal canal stenosis. No neuroforaminal stenosis.   L3-L4: Mild disc bulging and bilateral facet arthropathy, progressed since the prior study. No stenosis.   L4-L5: Similar mild disc bulging. Progressive severe bilateral facet arthropathy and ligamentum flavum hypertrophy. Worsening severe spinal canal stenosis. No neuroforaminal stenosis.   L5-S1: Progressive mild disc bulging with enlarging superimposed right subarticular disc protrusion. Progressive severe right and moderate left facet arthropathy. Worsened now severe spinal canal and right lateral recess stenosis. Unchanged severe right and mild left neuroforaminal stenosis.   IMPRESSION: 1. Progressive multilevel lumbar spondylosis as described above. Worsening severe spinal canal stenosis at L4-L5 and L5-S1. Unchanged severe right neuroforaminal stenosis at L5-S1.     Electronically Signed   By: Obie Dredge M.D.   On: 09/02/2023 14:16   Please note that I have compared her x-rays from October 2024 which show an 8 mm anterolisthesis of L4 on L5.  I have measured it on her MRI scan where it is 3 mm.  I have personally reviewed the images and agree with the above interpretation.  Assessment and Plan: [name] is a pleasant 63 y.o. female with back pain with right lower extremity sciatica.  She has weakness in her  right leg.  She has tried and failed conservative management including physical therapy and medications.  Her symptoms have been worsening over many years.  She has evidence of lumbar spinal instability as she has 3 mm of anterolisthesis of L4 and L5 when she is supine but extends to 8 mm when she bears weight.  She has severe lumbar spinal stenosis at L4-5 and L5-S1.  She has extensive facet arthrosis at L5-S1.  She has tried and failed conservative management.     We discussed the planned procedure at length.  I answered the patient and her husband's questions to the best my ability.  We will plan to move forward with surgery.    I spent a total of 10 minutes in this patient's care today. This time was spent reviewing pertinent records including imaging studies, obtaining and confirming history, performing a directed evaluation, formulating and discussing my recommendations, and documenting the visit within the medical record.     Thank  you for involving me in the care of this patient.      Jace Fermin K. Myer Haff MD, [facility] Neurosurgery

## 2023-10-10 ENCOUNTER — Telehealth: Payer: Self-pay | Admitting: Neurosurgery

## 2023-10-10 ENCOUNTER — Other Ambulatory Visit: Payer: Self-pay | Admitting: Neurosurgery

## 2023-10-10 NOTE — Telephone Encounter (Addendum)
 Patient is having throbbing low back 10/10 with activity. She says that it radiates through her right hip and down to her toes.

## 2023-10-10 NOTE — Telephone Encounter (Signed)
 L4-S1 MIS TLIF on 11/02/23  She is taking OTC Advil Dual Action to control her pain. It is not working any more she is asking if you can prescribe her something to get through to surgery. I did review the protocol with her regarding pain medication before surgery. She understands but she is in a lot of pain. [employer] in [place]

## 2023-10-11 ENCOUNTER — Other Ambulatory Visit: Payer: Self-pay | Admitting: Neurosurgery

## 2023-10-11 ENCOUNTER — Other Ambulatory Visit: Payer: Self-pay

## 2023-10-11 MED ORDER — GABAPENTIN 300 MG PO CAPS: 300.0000 mg | ORAL_CAPSULE | Freq: Three times a day (TID) | ORAL | 0 refills | Status: DC

## 2023-10-11 MED ORDER — TRAMADOL HCL 50 MG PO TABS: 50.0000 mg | ORAL_TABLET | Freq: Two times a day (BID) | ORAL | 0 refills | Status: DC | PRN

## 2023-10-11 NOTE — Telephone Encounter (Signed)
 I called Ms. Bondar to discuss Danielle's medications instructions.   She is aware to start taking 300mg  at night for three days. After three days to take 300mg  twice a day then after another 3 days to take 300mg  3 times daily.   We discussed that we do not recommend driving while taking tramadol and that she should not be activily taking this medication if she plans on driving.

## 2023-10-11 NOTE — Progress Notes (Signed)
 PDMP reviewed an appropriate. Started Gabapentin 300mg  TID and Tramadol BID PRN for back and radicular pain

## 2023-10-11 NOTE — Telephone Encounter (Signed)
 I changed the Pharmacy at the request of the patient. Danielle, Can you co-sign the change please. I will also call her and make sure she is aware of the instructions you provided.

## 2023-10-11 NOTE — Telephone Encounter (Signed)
 Patient does not use [facility] , she would like for her medications to go to [facility] [place] - [place], [place] - [address]    She called this morning stating she got a message from [facility] about new medication.

## 2023-10-11 NOTE — Telephone Encounter (Signed)
 Patient is calling that [facility] has not received her Tramadol RX can you call it in please.

## 2023-10-20 ENCOUNTER — Other Ambulatory Visit: Payer: Self-pay

## 2023-10-20 ENCOUNTER — Encounter
Admission: RE | Admit: 2023-10-20 | Discharge: 2023-10-20 | Disposition: A | Discharge status: Home / Self Care | Payer: Medicare (Managed Care) | Source: Ambulatory Visit | Attending: Neurosurgery | Admitting: Neurosurgery

## 2023-10-20 VITALS — BP 99/42 | HR 77 | Temp 98.4°F | Resp 20 | Ht <= 58 in | Wt 198.6 lb

## 2023-10-20 DIAGNOSIS — Z01810 Encounter for preprocedural cardiovascular examination: Secondary | ICD-10-CM | POA: Diagnosis present

## 2023-10-20 DIAGNOSIS — R9431 Abnormal electrocardiogram [ECG] [EKG]: Secondary | ICD-10-CM | POA: Diagnosis not present

## 2023-10-20 DIAGNOSIS — I10 Essential (primary) hypertension: Secondary | ICD-10-CM | POA: Insufficient documentation

## 2023-10-20 DIAGNOSIS — Z01812 Encounter for preprocedural laboratory examination: Secondary | ICD-10-CM

## 2023-10-20 DIAGNOSIS — R0789 Other chest pain: Secondary | ICD-10-CM | POA: Diagnosis not present

## 2023-10-20 DIAGNOSIS — M532X6 Spinal instabilities, lumbar region: Secondary | ICD-10-CM | POA: Insufficient documentation

## 2023-10-20 DIAGNOSIS — Z01818 Encounter for other preprocedural examination: Secondary | ICD-10-CM | POA: Diagnosis not present

## 2023-10-20 HISTORY — DX: Cerebral infarction, unspecified: I63.9

## 2023-10-20 HISTORY — DX: Personal history of urinary calculi: Z87.442

## 2023-10-20 HISTORY — DX: Personal history of other mental and behavioral disorders: Z86.59

## 2023-10-20 LAB — BASIC METABOLIC PANEL WITH GFR
Anion gap: 11 (ref 5–15)
BUN: 23 mg/dL (ref 8–23)
CO2: 27 mmol/L (ref 22–32)
Calcium: 9.5 mg/dL (ref 8.9–10.3)
Chloride: 97 mmol/L — ABNORMAL LOW (ref 98–111)
Creatinine, Ser: 0.87 mg/dL (ref 0.44–1.00)
GFR, Estimated: >60 mL/min (ref >60)
Glucose, Bld: 104 mg/dL — ABNORMAL HIGH (ref 70–99)
Potassium: 3.3 mmol/L — ABNORMAL LOW (ref 3.5–5.1)
Sodium: 135 mmol/L (ref 135–145)

## 2023-10-20 LAB — CBC
HCT: 37.8 % (ref 36.0–46.0)
Hemoglobin: 13.3 g/dL (ref 12.0–15.0)
MCH: 33.3 pg (ref 26.0–34.0)
MCHC: 35.2 g/dL (ref 30.0–36.0)
MCV: 94.5 fL (ref 80.0–100.0)
Platelets: 184 10*3/uL (ref 150–400)
RBC: 4 MIL/uL (ref 3.87–5.11)
RDW: 12.1 % (ref 11.5–15.5)
WBC: 7.1 10*3/uL (ref 4.0–10.5)
nRBC: 0 % (ref 0.0–0.2)

## 2023-10-20 LAB — URINALYSIS, COMPLETE (UACMP) WITH MICROSCOPIC
Bilirubin Urine: NEGATIVE
Glucose, UA: NEGATIVE mg/dL
Ketones, ur: NEGATIVE mg/dL
Leukocytes,Ua: NEGATIVE
Nitrite: NEGATIVE
Protein, ur: NEGATIVE mg/dL
Specific Gravity, Urine: 1.02 (ref 1.005–1.030)
Squamous Epithelial / HPF: 0 /HPF (ref 0–5)
pH: 5 (ref 5.0–8.0)

## 2023-10-20 LAB — TYPE AND SCREEN
ABO/RH(D): O NEG
Antibody Screen: NEGATIVE

## 2023-10-20 LAB — SURGICAL PCR SCREEN
MRSA, PCR: NEGATIVE
Staphylococcus aureus: POSITIVE — AB

## 2023-10-20 NOTE — Patient Instructions (Addendum)
 Your procedure is scheduled on: 4/16 /2025  Wednesday Report to the Registration Desk on the 1st floor of the [facility]. To find out your arrival time, please call [phone] between 1PM - 3PM on: Tuesday, April 15/225  If your arrival time is 6:00 am, do not arrive before that time as the [facility] entrance doors do not open until 6:00 am.  REMEMBER: Instructions that are not followed completely may result in serious medical risk, up to and including death; or upon the discretion of your surgeon and anesthesiologist your surgery may need to be rescheduled.  Do not eat food after midnight the night before surgery.  No gum chewing or hard candies.  You may however, drink CLEAR liquids up to 2 hours before you are scheduled to arrive for your surgery. Do not drink anything within 2 hours of your scheduled arrival time.  Clear liquids include: - water  - apple juice without pulp - gatorade (not RED colors) - black coffee or tea (Do NOT add milk or creamers to the coffee or tea) Do NOT drink anything that is not on this list.    One week prior to surgery: Stop Anti-inflammatories (NSAIDS) such as Advil, Aleve, Ibuprofen, Motrin, Naproxen, Naprosyn and Aspirin based products such as Excedrin, Goody's Powder, BC Powder. Stop ANY OVER THE COUNTER supplements until after surgery.  You may however, continue to take Tylenol if needed for pain up until the day of surgery.  Continue taking all of your other prescription medications up until the day of surgery.  ON THE DAY OF SURGERY ONLY TAKE THESE MEDICATIONS WITH SIPS OF WATER:  amLODipine (NORVASC) 2.   gabapentin (NEURONTIN)  3.  atorvastatin (LIPITOR)  No Alcohol for 24 hours before or after surgery.  No Smoking including e-cigarettes for 24 hours before surgery.  No chewable tobacco products for at least 6 hours before surgery.  No nicotine patches on the day of surgery.  Do not use any "recreational" drugs for at least a  week (preferably 2 weeks) before your surgery.  Please be advised that the combination of cocaine and anesthesia may have negative outcomes, up to and including death. If you test positive for cocaine, your surgery will be cancelled.  On the morning of surgery brush your teeth with toothpaste and water, you may rinse your mouth with mouthwash if you wish. Do not swallow any toothpaste or mouthwash.  Use CHG Soap or wipes as directed on instruction sheet.- provided for you   Do not wear jewelry, make-up, hairpins, clips or nail polish.  For welded (permanent) jewelry: bracelets, anklets, waist bands, etc.  Please have this removed prior to surgery.  If it is not removed, there is a chance that hospital personnel will need to cut it off on the day of surgery.  Do not wear lotions, powders, or perfumes.   Do not shave body hair from the neck down 48 hours before surgery.  Contact lenses, hearing aids and dentures may not be worn into surgery.  Do not bring valuables to the hospital. [facility] is not responsible for any missing/lost belongings or valuables.   Notify your doctor if there is any change in your medical condition (cold, fever, infection).  Wear comfortable clothing (specific to your surgery type) to the hospital.  After surgery, you can help prevent lung complications by doing breathing exercises.  Take deep breaths and cough every 1-2 hours. Your doctor may order a device called an Incentive Spirometer to help  you take deep breaths.  If you are being admitted to the hospital overnight, leave your suitcase in the car. After surgery it may be brought to your room.  In case of increased patient census, it may be necessary for you, the patient, to continue your postoperative care in the Same Day Surgery department.  If you are being discharged the day of surgery, you will not be allowed to drive home. You will need a responsible individual to drive you home and stay with you  for 24 hours after surgery.   Please call the Pre-admissions Testing Dept. at [phone] if you have any questions about these instructions.  Surgery Visitation Policy:  Patients having surgery or a procedure may have two visitors.  Children under the age of 26 must have an adult with them who is not the patient.  Inpatient Visitation:    Visiting hours are 7 a.m. to 8 p.m. Up to four visitors are allowed at one time in a patient room. The visitors may rotate out with other people during the day.  One visitor age 25 or older may stay with the patient overnight and must be in the room by 8 p.m.       Pre-operative 5 CHG Bath Instructions   You can play a key role in reducing the risk of infection after surgery. Your skin needs to be as free of germs as possible. You can reduce the number of germs on your skin by washing with CHG (chlorhexidine gluconate) soap before surgery. CHG is an antiseptic soap that kills germs and continues to kill germs even after washing.   DO NOT use if you have an allergy to chlorhexidine/CHG or antibacterial soaps. If your skin becomes reddened or irritated, stop using the CHG and notify one of our RNs at [phone].   Please shower with the CHG soap starting 4 days before surgery using the following schedule:       Please keep in mind the following:  DO NOT shave, including legs and underarms, starting the day of your first shower.   You may shave your face at any point before/day of surgery.  Place clean sheets on your bed the day you start using CHG soap. Use a clean washcloth (not used since being washed) for each shower. DO NOT sleep with pets once you start using the CHG.   CHG Shower Instructions:  If you choose to wash your hair and private area, wash first with your normal shampoo/soap.  After you use shampoo/soap, rinse your hair and body thoroughly to remove shampoo/soap residue.  Turn the water OFF and apply about 3 tablespoons  (45 ml) of CHG soap to a CLEAN washcloth.  Apply CHG soap ONLY FROM YOUR NECK DOWN TO YOUR TOES (washing for 3-5 minutes)  DO NOT use CHG soap on face, private areas, open wounds, or sores.  Pay special attention to the area where your surgery is being performed.  If you are having back surgery, having someone wash your back for you may be helpful. Wait 2 minutes after CHG soap is applied, then you may rinse off the CHG soap.  Pat dry with a clean towel  Put on clean clothes/pajamas   If you choose to wear lotion, please use ONLY the CHG-compatible lotions on the back of this paper.     Additional instructions for the day of surgery: DO NOT APPLY any lotions, deodorants, cologne, or perfumes.   Put on clean/comfortable clothes.  Brush your  teeth.  Ask your nurse before applying any prescription medications to the skin.      CHG Compatible Lotions   Aveeno Moisturizing lotion  Cetaphil Moisturizing Cream  Cetaphil Moisturizing Lotion  Clairol Herbal Essence Moisturizing Lotion, Dry Skin  Clairol Herbal Essence Moisturizing Lotion, Extra Dry Skin  Clairol Herbal Essence Moisturizing Lotion, Normal Skin  Curel Age Defying Therapeutic Moisturizing Lotion with Alpha Hydroxy  Curel Extreme Care Body Lotion  Curel Soothing Hands Moisturizing Hand Lotion  Curel Therapeutic Moisturizing Cream, Fragrance-Free  Curel Therapeutic Moisturizing Lotion, Fragrance-Free  Curel Therapeutic Moisturizing Lotion, Original Formula  Eucerin Daily Replenishing Lotion  Eucerin Dry Skin Therapy Plus Alpha Hydroxy Crme  Eucerin Dry Skin Therapy Plus Alpha Hydroxy Lotion  Eucerin Original Crme  Eucerin Original Lotion  Eucerin Plus Crme Eucerin Plus Lotion  Eucerin TriLipid Replenishing Lotion  Keri Anti-Bacterial Hand Lotion  Keri Deep Conditioning Original Lotion Dry Skin Formula Softly Scented  Keri Deep Conditioning Original Lotion, Fragrance Free Sensitive Skin Formula  Keri Lotion Fast  Absorbing Fragrance Free Sensitive Skin Formula  Keri Lotion Fast Absorbing Softly Scented Dry Skin Formula  Keri Original Lotion  Keri Skin Renewal Lotion Keri Silky Smooth Lotion  Keri Silky Smooth Sensitive Skin Lotion  Nivea Body Creamy Conditioning Oil  Nivea Body Extra Enriched Teacher, adult education Moisturizing Lotion Nivea Crme  Nivea Skin Firming Lotion  NutraDerm 30 Skin Lotion  NutraDerm Skin Lotion  NutraDerm Therapeutic Skin Cream  NutraDerm Therapeutic Skin Lotion  ProShield Protective Hand Cream  Provon moisturizing lotion

## 2023-10-27 ENCOUNTER — Telehealth: Payer: Self-pay | Admitting: Neurosurgery

## 2023-10-27 NOTE — Telephone Encounter (Signed)
 Spoke with patient- she is taking it 3 times a day and she still has a lot left. She did not know her pharmacy sent this over she is okay for now, does not know if she'll need this again but she will let us know.

## 2023-10-27 NOTE — Telephone Encounter (Signed)
 Given 30 day supply on 3/25. Refill not due until 4/24.   I am denying refill now as it is too early, but have her let us know when she needs it.

## 2023-11-02 ENCOUNTER — Inpatient Hospital Stay: Payer: Medicare (Managed Care)

## 2023-11-02 ENCOUNTER — Encounter
Admission: RE | Disposition: A | Discharge status: Home Health | Payer: Self-pay | Source: Ambulatory Visit | Attending: Neurosurgery

## 2023-11-02 ENCOUNTER — Other Ambulatory Visit: Payer: Self-pay

## 2023-11-02 ENCOUNTER — Inpatient Hospital Stay: Payer: Medicare (Managed Care) | Admitting: Urgent Care

## 2023-11-02 ENCOUNTER — Encounter: Payer: Self-pay | Admitting: Neurosurgery

## 2023-11-02 ENCOUNTER — Inpatient Hospital Stay
Admission: RE | Admit: 2023-11-02 | Discharge: 2023-11-04 | DRG: 428 | Disposition: A | Discharge status: Home Health | Payer: Medicare (Managed Care) | Source: Ambulatory Visit | Attending: Neurosurgery | Admitting: Neurosurgery

## 2023-11-02 DIAGNOSIS — Z01812 Encounter for preprocedural laboratory examination: Principal | ICD-10-CM

## 2023-11-02 DIAGNOSIS — Z87891 Personal history of nicotine dependence: Secondary | ICD-10-CM | POA: Diagnosis not present

## 2023-11-02 DIAGNOSIS — M4316 Spondylolisthesis, lumbar region: Secondary | ICD-10-CM | POA: Diagnosis present

## 2023-11-02 DIAGNOSIS — Z01818 Encounter for other preprocedural examination: Secondary | ICD-10-CM

## 2023-11-02 DIAGNOSIS — Z8673 Personal history of transient ischemic attack (TIA), and cerebral infarction without residual deficits: Secondary | ICD-10-CM | POA: Diagnosis not present

## 2023-11-02 DIAGNOSIS — M5441 Lumbago with sciatica, right side: Secondary | ICD-10-CM | POA: Diagnosis present

## 2023-11-02 DIAGNOSIS — F40240 Claustrophobia: Secondary | ICD-10-CM | POA: Diagnosis present

## 2023-11-02 DIAGNOSIS — M4807 Spinal stenosis, lumbosacral region: Secondary | ICD-10-CM | POA: Diagnosis present

## 2023-11-02 DIAGNOSIS — Z9102 Food additives allergy status: Secondary | ICD-10-CM

## 2023-11-02 DIAGNOSIS — I10 Essential (primary) hypertension: Secondary | ICD-10-CM | POA: Diagnosis present

## 2023-11-02 DIAGNOSIS — M48062 Spinal stenosis, lumbar region with neurogenic claudication: Secondary | ICD-10-CM | POA: Diagnosis present

## 2023-11-02 DIAGNOSIS — Z87442 Personal history of urinary calculi: Secondary | ICD-10-CM | POA: Diagnosis not present

## 2023-11-02 DIAGNOSIS — Z981 Arthrodesis status: Secondary | ICD-10-CM

## 2023-11-02 DIAGNOSIS — G8929 Other chronic pain: Secondary | ICD-10-CM | POA: Diagnosis present

## 2023-11-02 DIAGNOSIS — M47817 Spondylosis without myelopathy or radiculopathy, lumbosacral region: Secondary | ICD-10-CM | POA: Diagnosis present

## 2023-11-02 DIAGNOSIS — M532X6 Spinal instabilities, lumbar region: Secondary | ICD-10-CM | POA: Diagnosis present

## 2023-11-02 HISTORY — PX: APPLICATION OF INTRAOPERATIVE CT SCAN: SHX6668

## 2023-11-02 HISTORY — PX: TRANSFORAMINAL LUMBAR INTERBODY FUSION W/ MIS 2 LEVEL: SHX6146

## 2023-11-02 LAB — ABO/RH: ABO/RH(D): O NEG

## 2023-11-02 SURGERY — MINIMALLY INVASIVE (MIS) TRANSFORAMINAL LUMBAR INTERBODY FUSION (TLIF) 2 LEVEL
Anesthesia: General

## 2023-11-02 MED ORDER — MIDAZOLAM HCL 5 MG/5ML IJ SOLN
INTRAMUSCULAR | Status: DC | PRN
  Administered 2023-11-02: 2 mg via INTRAVENOUS

## 2023-11-02 MED ORDER — FLUTICASONE PROPIONATE 50 MCG/ACT NA SUSP
1.0000 | Freq: Every day | NASAL | Status: DC | PRN
Start: 2023-11-02 — End: 2023-11-04

## 2023-11-02 MED ORDER — SODIUM CHLORIDE 0.9% FLUSH: 3.0000 mL | INTRAVENOUS | Status: DC | PRN

## 2023-11-02 MED ORDER — VANCOMYCIN HCL IN DEXTROSE 1-5 GM/200ML-% IV SOLN
1000.0000 mg | Freq: Once | INTRAVENOUS | Status: AC
  Administered 2023-11-02: 1000 mg via INTRAVENOUS

## 2023-11-02 MED ORDER — FENTANYL CITRATE (PF) 100 MCG/2ML IJ SOLN
25.0000 ug | INTRAMUSCULAR | Status: DC | PRN
  Administered 2023-11-02: 25 ug via INTRAVENOUS

## 2023-11-02 MED ORDER — MUPIROCIN 2 % EX OINT
1.0000 | TOPICAL_OINTMENT | Freq: Two times a day (BID) | CUTANEOUS | 0 refills | Status: AC
Start: 2023-11-02 — End: 2023-12-02

## 2023-11-02 MED ORDER — SURGIFLO WITH THROMBIN (HEMOSTATIC MATRIX KIT) OPTIME
TOPICAL | Status: DC | PRN
  Administered 2023-11-02: 1 via TOPICAL

## 2023-11-02 MED ORDER — MIDAZOLAM HCL 2 MG/2ML IJ SOLN
INTRAMUSCULAR | Status: AC
  Filled 2023-11-02: qty 2

## 2023-11-02 MED ORDER — IRRISEPT - 450ML BOTTLE WITH 0.05% CHG IN STERILE WATER, USP 99.95% OPTIME
TOPICAL | Status: DC | PRN
  Administered 2023-11-02: 450 mL

## 2023-11-02 MED ORDER — OXYCODONE HCL 5 MG PO TABS
5.0000 mg | ORAL_TABLET | Freq: Once | ORAL | Status: AC | PRN
  Administered 2023-11-02: 5 mg via ORAL

## 2023-11-02 MED ORDER — KETAMINE HCL 10 MG/ML IJ SOLN
INTRAMUSCULAR | Status: DC | PRN
  Administered 2023-11-02: 20 mg via INTRAVENOUS
  Administered 2023-11-02 (×3): 10 mg via INTRAVENOUS

## 2023-11-02 MED ORDER — OXYCODONE HCL 5 MG PO TABS
5.0000 mg | ORAL_TABLET | ORAL | Status: DC | PRN
  Administered 2023-11-02 – 2023-11-03 (×4): 5 mg via ORAL
  Filled 2023-11-02 (×5): qty 1

## 2023-11-02 MED ORDER — ONDANSETRON HCL 4 MG/2ML IJ SOLN: 4.0000 mg | Freq: Four times a day (QID) | INTRAMUSCULAR | Status: DC | PRN

## 2023-11-02 MED ORDER — METHOCARBAMOL 500 MG PO TABS
500.0000 mg | ORAL_TABLET | Freq: Four times a day (QID) | ORAL | Status: DC | PRN
  Administered 2023-11-02 – 2023-11-04 (×3): 500 mg via ORAL
  Filled 2023-11-02 (×2): qty 1

## 2023-11-02 MED ORDER — MAGNESIUM CITRATE PO SOLN: 1.0000 | Freq: Once | ORAL | Status: DC | PRN

## 2023-11-02 MED ORDER — 0.9 % SODIUM CHLORIDE (POUR BTL) OPTIME
TOPICAL | Status: DC | PRN
  Administered 2023-11-02: 500 mL

## 2023-11-02 MED ORDER — PHENYLEPHRINE HCL-NACL 20-0.9 MG/250ML-% IV SOLN
INTRAVENOUS | Status: AC
  Filled 2023-11-02: qty 250

## 2023-11-02 MED ORDER — SODIUM CHLORIDE (PF) 0.9 % IJ SOLN
INTRAMUSCULAR | Status: AC
  Filled 2023-11-02: qty 20

## 2023-11-02 MED ORDER — MENTHOL 3 MG MT LOZG
1.0000 | LOZENGE | OROMUCOSAL | Status: DC | PRN
Start: 2023-11-02 — End: 2023-11-04

## 2023-11-02 MED ORDER — LIDOCAINE HCL (CARDIAC) PF 100 MG/5ML IV SOSY
PREFILLED_SYRINGE | INTRAVENOUS | Status: DC | PRN
  Administered 2023-11-02: 100 mg via INTRAVENOUS

## 2023-11-02 MED ORDER — PHENYLEPHRINE 80 MCG/ML (10ML) SYRINGE FOR IV PUSH (FOR BLOOD PRESSURE SUPPORT)
PREFILLED_SYRINGE | INTRAVENOUS | Status: DC | PRN
  Administered 2023-11-02: 80 ug via INTRAVENOUS

## 2023-11-02 MED ORDER — FENTANYL CITRATE (PF) 100 MCG/2ML IJ SOLN
INTRAMUSCULAR | Status: AC
  Filled 2023-11-02: qty 2

## 2023-11-02 MED ORDER — SODIUM CHLORIDE (PF) 0.9 % IJ SOLN
INTRAMUSCULAR | Status: DC | PRN
  Administered 2023-11-02: 60 mL

## 2023-11-02 MED ORDER — EPHEDRINE SULFATE-NACL 50-0.9 MG/10ML-% IV SOSY
PREFILLED_SYRINGE | INTRAVENOUS | Status: DC | PRN
  Administered 2023-11-02: 10 mg via INTRAVENOUS
  Administered 2023-11-02: 5 mg via INTRAVENOUS

## 2023-11-02 MED ORDER — CEFAZOLIN SODIUM-DEXTROSE 2-4 GM/100ML-% IV SOLN
INTRAVENOUS | Status: AC
  Filled 2023-11-02: qty 100

## 2023-11-02 MED ORDER — METHOCARBAMOL 500 MG PO TABS
ORAL_TABLET | ORAL | Status: AC
  Filled 2023-11-02: qty 1

## 2023-11-02 MED ORDER — BUPIVACAINE HCL (PF) 0.5 % IJ SOLN
INTRAMUSCULAR | Status: AC
  Filled 2023-11-02: qty 30

## 2023-11-02 MED ORDER — BUPIVACAINE-EPINEPHRINE (PF) 0.5% -1:200000 IJ SOLN
INTRAMUSCULAR | Status: DC | PRN
  Administered 2023-11-02: 20 mL via PERINEURAL

## 2023-11-02 MED ORDER — OXYCODONE HCL 5 MG PO TABS
10.0000 mg | ORAL_TABLET | ORAL | Status: DC | PRN
  Administered 2023-11-04: 10 mg via ORAL
  Filled 2023-11-02: qty 2

## 2023-11-02 MED ORDER — LACTATED RINGERS IV SOLN: INTRAVENOUS | Status: DC

## 2023-11-02 MED ORDER — DEXMEDETOMIDINE HCL IN NACL 80 MCG/20ML IV SOLN
INTRAVENOUS | Status: DC | PRN
  Administered 2023-11-02 (×2): 8 ug via INTRAVENOUS
  Administered 2023-11-02: 4 ug via INTRAVENOUS

## 2023-11-02 MED ORDER — PROPOFOL 1000 MG/100ML IV EMUL
INTRAVENOUS | Status: AC
  Filled 2023-11-02: qty 100

## 2023-11-02 MED ORDER — ACETAMINOPHEN 10 MG/ML IV SOLN
INTRAVENOUS | Status: DC | PRN
  Administered 2023-11-02: 1000 mg via INTRAVENOUS

## 2023-11-02 MED ORDER — PHENYLEPHRINE HCL-NACL 20-0.9 MG/250ML-% IV SOLN
INTRAVENOUS | Status: DC | PRN
  Administered 2023-11-02: 30 ug/min via INTRAVENOUS

## 2023-11-02 MED ORDER — GABAPENTIN 100 MG PO CAPS
300.0000 mg | ORAL_CAPSULE | Freq: Three times a day (TID) | ORAL | Status: DC
  Administered 2023-11-02 – 2023-11-04 (×6): 300 mg via ORAL
  Filled 2023-11-02 (×3): qty 3

## 2023-11-02 MED ORDER — CHLORHEXIDINE GLUCONATE 4 % EX SOLN: 1.0000 | CUTANEOUS | 1 refills | Status: DC

## 2023-11-02 MED ORDER — PROPOFOL 10 MG/ML IV BOLUS
INTRAVENOUS | Status: AC
  Filled 2023-11-02: qty 20

## 2023-11-02 MED ORDER — BUPIVACAINE-EPINEPHRINE (PF) 0.5% -1:200000 IJ SOLN
INTRAMUSCULAR | Status: AC
  Filled 2023-11-02: qty 10

## 2023-11-02 MED ORDER — FENTANYL CITRATE (PF) 100 MCG/2ML IJ SOLN
25.0000 ug | INTRAMUSCULAR | Status: DC | PRN
  Administered 2023-11-02 (×2): 50 ug via INTRAVENOUS
  Administered 2023-11-02 (×2): 25 ug via INTRAVENOUS

## 2023-11-02 MED ORDER — REMIFENTANIL HCL 1 MG IV SOLR
INTRAVENOUS | Status: AC
  Filled 2023-11-02: qty 1000

## 2023-11-02 MED ORDER — SODIUM CHLORIDE 0.9% FLUSH
3.0000 mL | Freq: Two times a day (BID) | INTRAVENOUS | Status: DC
  Administered 2023-11-02: 3 mL via INTRAVENOUS

## 2023-11-02 MED ORDER — POLYETHYLENE GLYCOL 3350 17 G PO PACK: 17.0000 g | PACK | Freq: Every day | ORAL | Status: DC | PRN

## 2023-11-02 MED ORDER — ONDANSETRON HCL 4 MG/2ML IJ SOLN
INTRAMUSCULAR | Status: DC | PRN
  Administered 2023-11-02: 4 mg via INTRAVENOUS

## 2023-11-02 MED ORDER — OXYCODONE HCL 5 MG PO TABS
ORAL_TABLET | ORAL | Status: AC
  Filled 2023-11-02: qty 1

## 2023-11-02 MED ORDER — HYDROMORPHONE HCL 1 MG/ML IJ SOLN
INTRAMUSCULAR | Status: AC
  Filled 2023-11-02: qty 1

## 2023-11-02 MED ORDER — ACETAMINOPHEN 10 MG/ML IV SOLN: 1000.0000 mg | Freq: Once | INTRAVENOUS | Status: DC | PRN

## 2023-11-02 MED ORDER — BUPIVACAINE LIPOSOME 1.3 % IJ SUSP
INTRAMUSCULAR | Status: AC
  Filled 2023-11-02: qty 20

## 2023-11-02 MED ORDER — ACETAMINOPHEN 650 MG RE SUPP: 650.0000 mg | RECTAL | Status: DC | PRN

## 2023-11-02 MED ORDER — CEFAZOLIN IN SODIUM CHLORIDE 2-0.9 GM/100ML-% IV SOLN
2.0000 g | Freq: Once | INTRAVENOUS | Status: AC
  Administered 2023-11-02: 2 g via INTRAVENOUS

## 2023-11-02 MED ORDER — ACETAMINOPHEN 325 MG PO TABS: 650.0000 mg | ORAL_TABLET | ORAL | Status: DC | PRN

## 2023-11-02 MED ORDER — ORAL CARE MOUTH RINSE: 15.0000 mL | Freq: Once | OROMUCOSAL | Status: AC

## 2023-11-02 MED ORDER — PROPOFOL 500 MG/50ML IV EMUL
INTRAVENOUS | Status: DC | PRN
Start: 2023-11-02 — End: 2023-11-02
  Administered 2023-11-02: 115 ug/kg/min via INTRAVENOUS
  Administered 2023-11-02: 150 ug/kg/min via INTRAVENOUS

## 2023-11-02 MED ORDER — REMIFENTANIL HCL 1 MG IV SOLR
INTRAVENOUS | Status: AC
Start: 2023-11-02 — End: ?
  Filled 2023-11-02: qty 1000

## 2023-11-02 MED ORDER — PHENOL 1.4 % MT LIQD
1.0000 | OROMUCOSAL | Status: DC | PRN
Start: 2023-11-02 — End: 2023-11-04

## 2023-11-02 MED ORDER — OXYCODONE HCL 5 MG/5ML PO SOLN: 5.0000 mg | Freq: Once | ORAL | Status: AC | PRN

## 2023-11-02 MED ORDER — ATORVASTATIN CALCIUM 10 MG PO TABS
20.0000 mg | ORAL_TABLET | Freq: Every day | ORAL | Status: DC
  Administered 2023-11-03 – 2023-11-04 (×2): 20 mg via ORAL
  Filled 2023-11-02 (×2): qty 2

## 2023-11-02 MED ORDER — ENOXAPARIN SODIUM 40 MG/0.4ML IJ SOSY
40.0000 mg | PREFILLED_SYRINGE | INTRAMUSCULAR | Status: DC
  Administered 2023-11-03 – 2023-11-04 (×2): 40 mg via SUBCUTANEOUS
  Filled 2023-11-02 (×2): qty 0.4

## 2023-11-02 MED ORDER — DEXAMETHASONE SODIUM PHOSPHATE 10 MG/ML IJ SOLN
INTRAMUSCULAR | Status: AC
  Filled 2023-11-02: qty 1

## 2023-11-02 MED ORDER — PSEUDOEPHEDRINE HCL 30 MG PO TABS: 30.0000 mg | ORAL_TABLET | Freq: Every day | ORAL | Status: DC | PRN

## 2023-11-02 MED ORDER — ONDANSETRON HCL 4 MG/2ML IJ SOLN
INTRAMUSCULAR | Status: AC
  Filled 2023-11-02: qty 2

## 2023-11-02 MED ORDER — KETOROLAC TROMETHAMINE 15 MG/ML IJ SOLN
15.0000 mg | Freq: Four times a day (QID) | INTRAMUSCULAR | Status: AC
  Administered 2023-11-02 – 2023-11-03 (×4): 15 mg via INTRAVENOUS
  Filled 2023-11-02 (×3): qty 1

## 2023-11-02 MED ORDER — VANCOMYCIN HCL IN DEXTROSE 1-5 GM/200ML-% IV SOLN
INTRAVENOUS | Status: AC
  Filled 2023-11-02: qty 200

## 2023-11-02 MED ORDER — SUCCINYLCHOLINE CHLORIDE 200 MG/10ML IV SOSY
PREFILLED_SYRINGE | INTRAVENOUS | Status: DC | PRN
  Administered 2023-11-02: 120 mg via INTRAVENOUS

## 2023-11-02 MED ORDER — KETAMINE HCL 50 MG/5ML IJ SOSY
PREFILLED_SYRINGE | INTRAMUSCULAR | Status: AC
  Filled 2023-11-02: qty 5

## 2023-11-02 MED ORDER — ONDANSETRON HCL 4 MG PO TABS: 4.0000 mg | ORAL_TABLET | Freq: Four times a day (QID) | ORAL | Status: DC | PRN

## 2023-11-02 MED ORDER — FENTANYL CITRATE (PF) 100 MCG/2ML IJ SOLN
INTRAMUSCULAR | Status: DC | PRN
  Administered 2023-11-02 (×2): 50 ug via INTRAVENOUS

## 2023-11-02 MED ORDER — DEXAMETHASONE SODIUM PHOSPHATE 10 MG/ML IJ SOLN
INTRAMUSCULAR | Status: DC | PRN
  Administered 2023-11-02: 10 mg via INTRAVENOUS

## 2023-11-02 MED ORDER — ACETAMINOPHEN 10 MG/ML IV SOLN
INTRAVENOUS | Status: AC
  Filled 2023-11-02: qty 100

## 2023-11-02 MED ORDER — SENNA 8.6 MG PO TABS
1.0000 | ORAL_TABLET | Freq: Two times a day (BID) | ORAL | Status: DC
  Administered 2023-11-02 – 2023-11-04 (×4): 8.6 mg via ORAL
  Filled 2023-11-02 (×3): qty 1

## 2023-11-02 MED ORDER — CHLORHEXIDINE GLUCONATE 0.12 % MT SOLN
OROMUCOSAL | Status: AC
  Filled 2023-11-02: qty 15

## 2023-11-02 MED ORDER — SORBITOL 70 % SOLN: 30.0000 mL | Freq: Every day | Status: DC | PRN

## 2023-11-02 MED ORDER — HYDROCHLOROTHIAZIDE 25 MG PO TABS
12.5000 mg | ORAL_TABLET | Freq: Every day | ORAL | Status: DC
  Administered 2023-11-02 – 2023-11-04 (×3): 12.5 mg via ORAL
  Filled 2023-11-02 (×3): qty 1

## 2023-11-02 MED ORDER — KETOROLAC TROMETHAMINE 15 MG/ML IJ SOLN
INTRAMUSCULAR | Status: AC
  Filled 2023-11-02: qty 1

## 2023-11-02 MED ORDER — ONDANSETRON HCL 4 MG/2ML IJ SOLN: 4.0000 mg | Freq: Once | INTRAMUSCULAR | Status: DC | PRN

## 2023-11-02 MED ORDER — HYDROMORPHONE HCL 1 MG/ML IJ SOLN
0.2500 mg | INTRAMUSCULAR | Status: DC | PRN
  Administered 2023-11-02 (×2): 0.5 mg via INTRAVENOUS

## 2023-11-02 MED ORDER — CHLORHEXIDINE GLUCONATE 0.12 % MT SOLN
15.0000 mL | Freq: Once | OROMUCOSAL | Status: AC
  Administered 2023-11-02: 15 mL via OROMUCOSAL

## 2023-11-02 MED ORDER — AMLODIPINE BESYLATE 5 MG PO TABS
2.5000 mg | ORAL_TABLET | Freq: Every day | ORAL | Status: DC
  Administered 2023-11-03 – 2023-11-04 (×2): 2.5 mg via ORAL

## 2023-11-02 MED ORDER — METHOCARBAMOL 1000 MG/10ML IJ SOLN
500.0000 mg | Freq: Four times a day (QID) | INTRAMUSCULAR | Status: DC | PRN
Start: 2023-11-02 — End: 2023-11-04

## 2023-11-02 MED ORDER — REMIFENTANIL HCL 1 MG IV SOLR
INTRAVENOUS | Status: DC | PRN
  Administered 2023-11-02: .15 ug/kg/min via INTRAVENOUS
  Administered 2023-11-02 (×2): .1 ug/kg/min via INTRAVENOUS

## 2023-11-02 MED ORDER — PROPOFOL 10 MG/ML IV BOLUS
INTRAVENOUS | Status: DC | PRN
  Administered 2023-11-02: 10 mg via INTRAVENOUS
  Administered 2023-11-02: 30 mg via INTRAVENOUS
  Administered 2023-11-02: 150 mg via INTRAVENOUS

## 2023-11-02 MED ORDER — LIDOCAINE HCL (PF) 2 % IJ SOLN
INTRAMUSCULAR | Status: AC
  Filled 2023-11-02: qty 5

## 2023-11-02 MED ORDER — ACETAMINOPHEN 500 MG PO TABS
1000.0000 mg | ORAL_TABLET | Freq: Four times a day (QID) | ORAL | Status: DC
  Administered 2023-11-02 – 2023-11-04 (×5): 1000 mg via ORAL
  Filled 2023-11-02 (×5): qty 2

## 2023-11-02 MED ORDER — SODIUM CHLORIDE 0.9 % IV SOLN: 250.0000 mL | INTRAVENOUS | Status: AC

## 2023-11-02 MED ORDER — DOCUSATE SODIUM 100 MG PO CAPS
100.0000 mg | ORAL_CAPSULE | Freq: Two times a day (BID) | ORAL | Status: DC
  Administered 2023-11-02 – 2023-11-04 (×4): 100 mg via ORAL
  Filled 2023-11-02 (×3): qty 1

## 2023-11-02 MED ORDER — ALBUMIN HUMAN 5 % IV SOLN: INTRAVENOUS | Status: DC | PRN

## 2023-11-02 SURGICAL SUPPLY — 57 items
ALLOGRAFT BONE FIBER KORE 5 (Bone Implant) IMPLANT
BASIN KIT SINGLE STR (MISCELLANEOUS) ×1 IMPLANT
BUR NEURO DRILL SOFT 3.0X3.8M (BURR) ×1 IMPLANT
COVERAGE SUPP BRAINLAB NG SPNE (MISCELLANEOUS) ×1 IMPLANT
DERMABOND ADVANCED .7 DNX12 (GAUZE/BANDAGES/DRESSINGS) ×1 IMPLANT
DRAPE C-ARMOR (DRAPES) IMPLANT
DRAPE LAPAROTOMY 100X77 ABD (DRAPES) ×1 IMPLANT
DRAPE MICROSCOPE SPINE 48X150 (DRAPES) IMPLANT
DRAPE SCAN PATIENT (DRAPES) ×1 IMPLANT
DRSG OPSITE POSTOP 4X8 (GAUZE/BANDAGES/DRESSINGS) IMPLANT
DRSG TEGADERM 4X4.75 (GAUZE/BANDAGES/DRESSINGS) IMPLANT
ELECT EZSTD 165MM 6.5IN (MISCELLANEOUS) ×1 IMPLANT
ELECT REM PT RETURN 9FT ADLT (ELECTROSURGICAL) ×1 IMPLANT
ELECTRODE EZSTD 165MM 6.5IN (MISCELLANEOUS) IMPLANT
ELECTRODE REM PT RTRN 9FT ADLT (ELECTROSURGICAL) ×1 IMPLANT
EVACUATOR 1/8 PVC DRAIN (DRAIN) IMPLANT
EX-PIN ORTHOLOCK NAV 4X150 (PIN) IMPLANT
FEE CVG SUPP BRAINLAB NG SPNE (MISCELLANEOUS) IMPLANT
GAUZE 4X4 16PLY ~~LOC~~+RFID DBL (SPONGE) IMPLANT
GAUZE SPONGE 2X2 STRL 8-PLY (GAUZE/BANDAGES/DRESSINGS) IMPLANT
GLOVE BIOGEL PI IND STRL 6.5 (GLOVE) ×2 IMPLANT
GLOVE SURG SYN 6.5 ES PF (GLOVE) ×4 IMPLANT
GLOVE SURG SYN 6.5 PF PI (GLOVE) ×2 IMPLANT
GLOVE SURG SYN 8.5 E (GLOVE) ×4 IMPLANT
GLOVE SURG SYN 8.5 PF PI (GLOVE) ×4 IMPLANT
GOWN SRG LRG LVL 4 IMPRV REINF (GOWNS) ×3 IMPLANT
GOWN SRG XL LVL 3 NONREINFORCE (GOWNS) ×1 IMPLANT
GUIDEWIRE NITINOL BEVEL TIP (WIRE) IMPLANT
HOLDER FOLEY CATH W/STRAP (MISCELLANEOUS) IMPLANT
INTERBODY SABLE 10X26 7-14 15D (Miscellaneous) IMPLANT
IV CATH ANGIO 12GX3 LT BLUE (NEEDLE) IMPLANT
JET LAVAGE IRRISEPT WOUND (IRRIGATION / IRRIGATOR) ×1 IMPLANT
KIT PREVENA INCISION MGT 13 (CANNISTER) IMPLANT
KIT SPINAL PRONEVIEW (KITS) ×1 IMPLANT
KNIFE BAYONET SHORT DISCETOMY (MISCELLANEOUS) IMPLANT
LAVAGE JET IRRISEPT WOUND (IRRIGATION / IRRIGATOR) ×1 IMPLANT
MANIFOLD NEPTUNE II (INSTRUMENTS) ×1 IMPLANT
MARKER SKIN DUAL TIP RULER LAB (MISCELLANEOUS) ×1 IMPLANT
MARKER SPHERE PSV REFLC 13MM (MARKER) ×7 IMPLANT
NDL SAFETY ECLIPSE 18X1.5 (NEEDLE) ×1 IMPLANT
NS IRRIG 500ML POUR BTL (IV SOLUTION) ×1 IMPLANT
PACK LAMINECTOMY ARMC (PACKS) ×1 IMPLANT
ROD RELINE MAS 5.5X55MM LORD (Rod) IMPLANT
ROD RELINE MAS LORDOTIC 5.5X60 (Rod) IMPLANT
SCREW LOCK RELINE 5.5 TULIP (Screw) IMPLANT
SCREW RELINE RED 6.5X45MM POLY (Screw) IMPLANT
STAPLER SKIN PROX 35W (STAPLE) IMPLANT
SURGIFLO W/THROMBIN 8M KIT (HEMOSTASIS) ×1 IMPLANT
SUT STRATA 3-0 15 PS-2 (SUTURE) ×1 IMPLANT
SUT VIC AB 0 CT1 27XCR 8 STRN (SUTURE) ×1 IMPLANT
SUT VIC AB 2-0 CT1 18 (SUTURE) ×1 IMPLANT
SYR 10ML LL (SYRINGE) ×1 IMPLANT
SYR 30ML LL (SYRINGE) ×2 IMPLANT
TOWEL OR 17X26 4PK STRL BLUE (TOWEL DISPOSABLE) ×1 IMPLANT
TRAP FLUID SMOKE EVACUATOR (MISCELLANEOUS) ×1 IMPLANT
TRAY FOLEY SLVR 16FR LF STAT (SET/KITS/TRAYS/PACK) IMPLANT
WATER STERILE IRR 1000ML POUR (IV SOLUTION) ×1 IMPLANT

## 2023-11-02 NOTE — Anesthesia Procedure Notes (Signed)
 Procedure Name: Intubation Date/Time: 11/02/2023 11:18 AM  Performed by: Morey Ar, RNPre-anesthesia Checklist: Patient identified, Emergency Drugs available, Suction available and Patient being monitored Patient Re-evaluated:Patient Re-evaluated prior to induction Oxygen Delivery Method: Circle system utilized Preoxygenation: Pre-oxygenation with 100% oxygen Induction Type: IV induction Ventilation: Mask ventilation without difficulty Laryngoscope Size: McGrath and 3 Grade View: Grade I Tube type: Oral Number of attempts: 1 Airway Equipment and Method: Stylet and Oral airway Placement Confirmation: ETT inserted through vocal cords under direct vision, positive ETCO2 and breath sounds checked- equal and bilateral Secured at: 21 cm Tube secured with: Tape Dental Injury: Teeth and Oropharynx as per pre-operative assessment  Comments: atraumatic

## 2023-11-02 NOTE — H&P (Signed)
 Referring Physician:  Venetia Night, MD [address] [place],  [place] [address]  Primary Physician:  Alvina Filbert, MD  History of Present Illness: 11/02/2023 Ms. Pitner presents today for surgery.  09/29/2023 Mrs. Donati presents today to discuss surgery.  Her husband is with her.  09/15/2023 Ms. Coreena Rubalcava is here today with a chief complaint of back and right leg pain.  She has had pain for many years.  She originally had injections more than a decade ago.  Over the past 2 to 3 years, her symptoms have been slowly worsening.  She now has severe right sided lower back pain that extends into her right leg.  She has trouble with walking more than a short distance.  She feels like her right leg is going to give out.  She has trouble with climbing steps as well.  She can only walk 100 250 feet before she has to stop.  When she stops, her pain improves.   Bowel/Bladder Dysfunction: none  Conservative measures:  Physical therapy: has participated in PT at [facility] in 11-06/2023 Multimodal medical therapy including regular antiinflammatories:  Prednisone, Methocarbamol Injections: no recent epidural steroid injections  Past Surgery: none  Adwoa R Brandhorst has no symptoms of cervical myelopathy.  The symptoms are causing a significant impact on the patient's life.   I have utilized the care everywhere function in epic to review the outside records available from external health systems.  Review of Systems:  A 10 point review of systems is negative, except for the pertinent positives and negatives detailed in the HPI.  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   Spinal stenosis    Stroke [facility])    2022     Past Surgical History: Past Surgical History:  Procedure Laterality Date   BREAST CYST EXCISION Left 07/19/2008   CESAREAN SECTION  07/19/1982   NOVASURE ABLATION  07/19/2008   ROTATOR CUFF REPAIR Right     Allergies: Allergies as of 09/19/2023 - Review Complete 09/15/2023  Allergen Reaction Noted   Cashew nut oil Anaphylaxis, Rash, and Other (See Comments) 04/08/2021    Medications:  Current Facility-Administered Medications:    ceFAZolin (ANCEF) IVPB 2g/100 mL premix, 2 g, Intravenous, Once, Venetia Night, MD   lactated ringers infusion, , Intravenous, Continuous, Laural Benes, Corey Harold, MD   vancomycin (VANCOCIN) IVPB 1000 mg/200 mL premix, 1,000 mg, Intravenous, Once, Venetia Night, MD  Social History: Social History   Tobacco Use   Smoking status: Former    Types: Cigarettes   Smokeless tobacco: Never   Tobacco comments:    Quit 2015  Substance Use Topics   Alcohol use: Yes   Drug use: No    Family Medical History: History reviewed. No pertinent family history.  Physical Examination: Vitals:   11/02/23 1006  BP: (!) 141/80  Pulse: 77  Resp: 18  Temp: (!) 97.4 F (36.3 C)  SpO2: 98%   Heart sounds normal no MRG. Chest Clear to Auscultation Bilaterally.  General: Patient is in no apparent distress. Attention to examination is appropriate.  Neck:   Supple.  Full range of motion.  Respiratory: Patient is breathing without any difficulty.   NEUROLOGICAL:     Awake,  alert, oriented to person, place, and time.  Speech is clear and fluent.   Cranial Nerves: Pupils equal round and reactive to light.  Facial tone is symmetric.  Facial sensation is symmetric. Shoulder shrug is symmetric. Tongue protrusion is midline.  There is no pronator drift.  Strength:  Continued right lower extremity weakness consistent with exam below Side Biceps Triceps Deltoid Interossei Grip Wrist Ext. Wrist Flex.  R 5 5 5 5 5 5 5   L 5 5 5 5 5 5 5    Side  Iliopsoas Quads Hamstring PF DF EHL  R 5 5 4 4 5 4   L 5 5 5 5 5 5    Reflexes are 2+ and symmetric at the biceps, triceps, brachioradialis, patella and achilles.   Hoffman's is absent.   Bilateral upper and lower extremity sensation is intact to light touch.    No evidence of dysmetria noted.  Gait is untested     Medical Decision Making  Imaging: MRI L spine 08/26/2023 Disc levels:   T11-T12: Mild disc bulging and facet arthropathy, progressed since prior study. No stenosis.   T12-L1:  Negative.   L1-L2:  Negative.   L2-L3: Mild disc bulging and bilateral facet arthropathy, progressed since the prior study. Mild spinal canal stenosis. No neuroforaminal stenosis.   L3-L4: Mild disc bulging and bilateral facet arthropathy, progressed since the prior study. No stenosis.   L4-L5: Similar mild disc bulging. Progressive severe bilateral facet arthropathy and ligamentum flavum hypertrophy. Worsening severe spinal canal stenosis. No neuroforaminal stenosis.   L5-S1: Progressive mild disc bulging with enlarging superimposed right subarticular disc protrusion. Progressive severe right and moderate left facet arthropathy. Worsened now severe spinal canal and right lateral recess stenosis. Unchanged severe right and mild left neuroforaminal stenosis.   IMPRESSION: 1. Progressive multilevel lumbar spondylosis as described above. Worsening severe spinal canal stenosis at L4-L5 and L5-S1. Unchanged severe right neuroforaminal stenosis at L5-S1.     Electronically Signed   By: Aleta Anda M.D.   On: 09/02/2023 14:16   Please note that I have compared her x-rays from October 2024 which show an 8 mm anterolisthesis of L4 on L5.  I have measured it on her MRI scan where it is 3 mm.  I have personally reviewed the images and agree with the above interpretation.  Assessment and Plan: Ms. Dadisman is a pleasant 63 y.o. female with back pain with right lower extremity sciatica.  She  has weakness in her right leg.  She has tried and failed conservative management including physical therapy and medications.  Her symptoms have been worsening over many years.  She has evidence of lumbar spinal instability as she has 3 mm of anterolisthesis of L4 and L5 when she is supine but extends to 8 mm when she bears weight.  She has severe lumbar spinal stenosis at L4-5 and L5-S1.  She has extensive facet arthrosis at L5-S1.  We will proceed with L4-S1 transforaminal lumbar interbody fusion.   Thank you for involving me in the care of this patient.      Kentravious Lipford K. Mont Antis MD, [facility] Neurosurgery

## 2023-11-02 NOTE — Op Note (Signed)
 Indications: 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 The patient tried and failed conservative management, prompting surgical intervention.  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   Preoperative Note:   Risks of surgery discussed include: infection, bleeding, stroke, coma, death, paralysis, CSF leak, nerve/spinal cord injury, numbness, tingling, weakness, complex regional pain syndrome, recurrent stenosis and/or disc herniation, vascular injury, development of instability, neck/back pain, need for further surgery, persistent symptoms, development of deformity, and the risks of anesthesia. The patient understood these risks and agreed to proceed.  Operative Note:  1. Transforaminal Lumbar Interbody Fusion L4/5 and L5/S1 2. Posterolateral arthrodesis L4 to S1 3. Posterior segmental instrumentation L4 to S1 using Nuvasive Reline 4. Use of stereotaxis ([employer]) 5. Harvesting of autograft via the same incision 6. Placement of a biomechanical device (Globus Sable) at L4/5 and L5/S1 for anterior arthrodesis  The patient was brought to the Operating Room, intubated and turned into the prone position. All pressure points were checked and double checked. The patient was prepped and draped in the standard fashion. A full timeout was performed. Preoperative antibiotics were  given.   After draping, the stereotactic array was placed.  Stereotactic imaging was acquired and registered to the [place] system.  The image guidance system was used to choose bilateral [place] incisions. The incisions were injected with local anesthetic.  Each incision was opened with a scalpel, bovie electrocautery was used to open the fascia.  Using stereotactic guidance, each pedicle from L4-S1was cannulated and K wire secured.  We then placed pedicle screws over each K wire on the contralateral side.  We placed the ipsilateral pedicle screws after the TLIF procedure was performed.  6.5x41mm Nuvasive Reline screws were used bilaterally at each level.   After placement of pedicle screws, we then turned our attention to transforaminal lumbar interbody fusion.  A tubular retractor was advanced over the right L5/S1 facet. The microscope was brought into the field.  The right L5/S1 facet was removed with osteotomes and the drill, and handed off for preparation as autograft. The traversing and exiting nerve roots on the right were identified and protected. The disc was opened using a scalpel. After incising the disc space, we took a combination of pituitary rongeurs, Kerrison rongeurs, disc scrapers, and curettes to remove a majority of the disc material.  We prepared the end plates for accepting the interbody fusion.  We removed the cartilaginous plate, preserved the cortical endplate if possible during this procedure.  The disc space was irrigated.  The Globus Sable biomechanical device was inserted, then backfilled with a mixture of allograft and autograft. During placement, the nerve roots and dural sac were carefully protected without any leaks identified. After placement of the device, the canal was fully inspected and all nerve roots were checked to ensure full decompression.  The retractor was removed.  A tubular retractor was then advanced over the right L4/5 facet. The microscope was brought  into the field.  The right L4/5 facet was removed with osteotomes and the drill,  and handed off for preparation as autograft. The traversing and exiting nerve roots on the right were identified and protected. The disc was opened using a scalpel. After incising the disc space, we took a combination of pituitary rongeurs, Kerrison rongeurs, disc scrapers, and curettes to remove a majority of the disc material.  We prepared the end plates for accepting the interbody fusion.  We removed the cartilaginous plate, preserved the cortical endplate if possible during this procedure.  The disc space was irrigated.  The Globus Sable biomechanical device was inserted, then backfilled with a mixture of allograft and autograft. During placement, the nerve roots and dural sac were carefully protected without any leaks identified. After placement of the device, the canal was fully inspected and all nerve roots were checked to ensure full decompression.  The retractor was removed.   Rods measured and placed. The rods were secured using locking caps to manufacturer's specifications. CT scan was taken to confirm instrumentation placement. The wound was copiously irrigated, then the posterior elements were prepared for arthrodesis.  A mixture of allograft and autograft was placed over the decorticated surfaces for arthrodesis.   A subfascial drain was placed on the right.  After hemostasis, the wound was closed in layers with 0 and 2-0 vicryl. 3-0 monocryl and dermabond was applied to the incision. A sterile dressing was placed.  The patient was then flipped supine and extubated with incident. All counts were correct times 2 at the end of the case. No immediate complications were noted.  Meredith Karvonen PA assisted in the entire procedure. An assistant was required for this procedure due to the complexity.  The assistant provided assistance in tissue manipulation and suction, and was required for the successful and safe  performance of the procedure. I performed the critical portions of the procedure.   Meredith Munch MD

## 2023-11-02 NOTE — Anesthesia Preprocedure Evaluation (Addendum)
 Anesthesia Evaluation  Patient identified by MRN, date of birth, ID band Patient awake    Reviewed: Allergy & Precautions, NPO status , Patient's Chart, lab work & pertinent test results  History of Anesthesia Complications Negative for: history of anesthetic complications  Airway Mallampati: I   Neck ROM: Full    Dental  (+) Missing, Chipped   Pulmonary former smoker (quit 20 years ago)   Pulmonary exam normal breath sounds clear to auscultation       Cardiovascular hypertension, Normal cardiovascular exam Rhythm:Regular Rate:Normal  ECG 10/20/23:  Normal sinus rhythm Inferior infarct , age undetermined Abnormal ECG When compared with ECG of 13-Nov-2014 11:39, No significant change was found  Echo 04/09/21:    1. Technically difficult study.    2. The left ventricle is normal in size with normal wall thickness.    3. The left ventricular systolic function is normal, LVEF is visually estimated at 55-60%.    4. The aortic valve is trileaflet with mildly thickened leaflets with normal excursion.    5. Mitral annular calcification is present (mild).    6. The right ventricle is normal in size.    7. There is no pulmonary hypertension.    Neuro/Psych CVA (2022, residual right leg weakness, uses cane)    GI/Hepatic negative GI ROS,,,  Endo/Other  negative endocrine ROS    Renal/GU Renal disease (nephrolithiasis)     Musculoskeletal  (+) Arthritis ,    Abdominal   Peds  Hematology negative hematology ROS (+)   Anesthesia Other Findings   Reproductive/Obstetrics                             Anesthesia Physical Anesthesia Plan  ASA: 3  Anesthesia Plan: General   Post-op Pain Management:    Induction: Intravenous  PONV Risk Score and Plan: 3 and Ondansetron, Dexamethasone and Treatment may vary due to age or medical condition  Airway Management Planned: Oral ETT  Additional  Equipment:   Intra-op Plan:   Post-operative Plan: Extubation in OR  Informed Consent: I have reviewed the patients History and Physical, chart, labs and discussed the procedure including the risks, benefits and alternatives for the proposed anesthesia with the patient or authorized representative who has indicated his/her understanding and acceptance.     Dental advisory given  Plan Discussed with: CRNA  Anesthesia Plan Comments: (Patient consented for risks of anesthesia including but not limited to:  - adverse reactions to medications - damage to eyes, teeth, lips or other oral mucosa - nerve damage due to positioning  - sore throat or hoarseness - damage to heart, brain, nerves, lungs, other parts of body or loss of life  Informed patient about role of CRNA in peri- and intra-operative care.  Patient voiced understanding.)        Anesthesia Quick Evaluation

## 2023-11-02 NOTE — Transfer of Care (Signed)
 Immediate Anesthesia Transfer of Care Note  Patient: Meredith Weber  Procedure(s) Performed: L4-S1 MINIMALLY INVASIVE (MIS) TRANSFORAMINAL LUMBAR INTERBODY FUSION (TLIF) APPLICATION OF INTRAOPERATIVE CT SCAN  Patient Location: PACU  Anesthesia Type:General  Level of Consciousness: drowsy and patient cooperative  Airway & Oxygen Therapy: Patient Spontanous Breathing and Patient connected to face mask oxygen  Post-op Assessment: Report given to RN and Post -op Vital signs reviewed and stable  Post vital signs: Reviewed and stable  Last Vitals:  Vitals Value Taken Time  BP 124/74 11/02/23 1501  Temp    Pulse 80 11/02/23 1505  Resp 15 11/02/23 1505  SpO2 94 % 11/02/23 1505  Vitals shown include unfiled device data.  Last Pain:  Vitals:   11/02/23 1006  PainSc: 0-No pain         Complications: There were no known notable events for this encounter.

## 2023-11-02 NOTE — Progress Notes (Signed)
 Orthopedic Tech Progress Note Patient Details:  Meredith Weber 11/09/1960 [id number]  Called in order to [employer] for a TLSO BRACE   Patient ID: Meredith Weber, female   DOB: 06-02-1961, 63 y.o.   MRN: [id number]  Kermitt Pedlar 11/02/2023, 6:38 PM

## 2023-11-02 NOTE — Discharge Instructions (Addendum)
 Your surgeon has performed an operation on your lumbar spine (low back) to relieve pressure on one or more nerves. Many times, patients feel better immediately after surgery and can "overdo it." Even if you feel well, it is important that you follow these activity guidelines. If you do not let your back heal properly from the surgery, you can increase the chance of hardware complications and/or return of your symptoms. The following are instructions to help in your recovery once you have been discharged from the hospital.  Do not use NSAIDs for 3 months after surgery.   Activity    No bending, lifting, or twisting ("BLT"). Avoid lifting objects heavier than 10 pounds (gallon milk jug).  Where possible, avoid household activities that involve lifting, bending, pushing, or pulling such as laundry, vacuuming, grocery shopping, and childcare. Try to arrange for help from friends and family for these activities while your back heals.  Increase physical activity slowly as tolerated.  Taking short walks is encouraged, but avoid strenuous exercise. Do not jog, run, bicycle, lift weights, or participate in any other exercises unless specifically allowed by your doctor. Avoid prolonged sitting, including car rides.  Talk to your doctor before resuming sexual activity.  You should not drive until cleared by your doctor.  Until released by your doctor, you should not return to work or school.  You should rest at home and let your body heal.   You may shower three days after your surgery.  After showering, lightly dab your incision dry. Do not take a tub bath or go swimming for 3 weeks, or until approved by your doctor at your follow-up appointment.  If you smoke, we strongly recommend that you quit.  Smoking has been proven to interfere with normal healing in your back and will dramatically reduce the success rate of your surgery. Please contact [facility] (800-QUIT-NOW) and use the resources at  www.[facility].com for assistance in stopping smoking.  Surgical Incision   If you have a dressing on your incision, you may remove it three days after your surgery. Keep your incision area clean and dry.  Your incision was closed with Dermabond glue. The glue should begin to peel away within about a week.  Diet            You may return to your usual diet. Be sure to stay hydrated.  When to Contact Us   Although your surgery and recovery will likely be uneventful, you may have some residual numbness, aches, and pains in your back and/or legs. This is normal and should improve in the next few weeks.  However, should you experience any of the following, contact us  immediately: New numbness or weakness Pain that is progressively getting worse, and is not relieved by your pain medications or rest Bleeding, redness, swelling, pain, or drainage from surgical incision Chills or flu-like symptoms Fever greater than 101.0 F (38.3 C) Problems with bowel or bladder functions Difficulty breathing or shortness of breath Warmth, tenderness, or swelling in your calf  Contact Information How to contact us :  If you have any questions/concerns before or after surgery, you can reach us  at [phone], or you can send a mychart message. We can be reached by phone or mychart 8am-4pm, Monday-Friday.  *Please note: Calls after 4pm are forwarded to a third party answering service. Mychart messages are not routinely monitored during evenings, weekends, and holidays. Please call our office to contact the answering service for urgent concerns during non-business hours.  [facility] They will call you to set up when they are coming out to see you   [address], [place], [place] [address] Hours:  Open ? Closes 5?PM Phone: [phone]

## 2023-11-03 ENCOUNTER — Encounter: Payer: Self-pay | Admitting: Neurosurgery

## 2023-11-03 MED ORDER — DOCUSATE SODIUM 100 MG PO CAPS
ORAL_CAPSULE | ORAL | Status: AC
  Filled 2023-11-03: qty 1

## 2023-11-03 MED ORDER — GABAPENTIN 300 MG PO CAPS
ORAL_CAPSULE | ORAL | Status: AC
  Filled 2023-11-03: qty 1

## 2023-11-03 MED ORDER — AMLODIPINE BESYLATE 5 MG PO TABS
ORAL_TABLET | ORAL | Status: AC
  Filled 2023-11-03: qty 1

## 2023-11-03 MED ORDER — ACETAMINOPHEN 500 MG PO TABS
ORAL_TABLET | ORAL | Status: AC
  Filled 2023-11-03: qty 2

## 2023-11-03 MED ORDER — SENNA 8.6 MG PO TABS
ORAL_TABLET | ORAL | Status: AC
  Filled 2023-11-03: qty 1

## 2023-11-03 MED ORDER — GABAPENTIN 100 MG PO CAPS
ORAL_CAPSULE | ORAL | Status: AC
  Filled 2023-11-03: qty 3

## 2023-11-03 NOTE — Plan of Care (Signed)
  Problem: Clinical Measurements: Goal: Cardiovascular complication will be avoided Outcome: Progressing   Problem: Activity: Goal: Risk for activity intolerance will decrease Outcome: Progressing   Problem: Activity: Goal: Ability to tolerate increased activity will improve Outcome: Progressing

## 2023-11-03 NOTE — Anesthesia Postprocedure Evaluation (Signed)
 Anesthesia Post Note  Patient: Meredith Weber  Procedure(s) Performed: L4-S1 MINIMALLY INVASIVE (MIS) TRANSFORAMINAL LUMBAR INTERBODY FUSION (TLIF) APPLICATION OF INTRAOPERATIVE CT SCAN  Patient location during evaluation: PACU Anesthesia Type: General Level of consciousness: awake and alert, oriented and patient cooperative Pain management: pain level controlled Vital Signs Assessment: post-procedure vital signs reviewed and stable Respiratory status: spontaneous breathing, nonlabored ventilation and respiratory function stable Cardiovascular status: blood pressure returned to baseline and stable Postop Assessment: adequate PO intake Anesthetic complications: no   There were no known notable events for this encounter.   Last Vitals:  Vitals:   11/02/23 2239 11/03/23 0338  BP: 114/73 (!) 122/92  Pulse: 84 71  Resp: 18 18  Temp: (!) 36.2 C (!) 36.4 C  SpO2: 96% 97%    Last Pain:  Vitals:   11/03/23 0615  TempSrc:   PainSc: 2                  Luis Sami

## 2023-11-03 NOTE — Progress Notes (Signed)
 Orthopedic Tech Progress Note Patient Details:  Meredith Weber 1960-11-29 [id number]  Patient ID: [name], female   DOB: [birth year], 63 y.o.   MRN: [id number] LSO ordered from [facility]. Herbie Loll 11/03/2023, 4:03 PM

## 2023-11-03 NOTE — Progress Notes (Signed)
   Neurosurgery Progress Note  History: Meredith Weber is s/p L4-S1 MIS TLIF  POD1: Complaining of right buttock/ hip pain similar to pre-op. She is otherwise doing well  Physical Exam: Vitals:   11/03/23 0338 11/03/23 0827  BP: (!) 122/92 119/79  Pulse: 71 73  Resp: 18 18  Temp: (!) 97.5 F (36.4 C) 98.4 F (36.9 C)  SpO2: 97% 94%    AA Ox3 CNI  Strength:5/5 throughout BLE except 4- right HF HV output 30 since surgery  Data:  Other tests/results: NA  Assessment/Plan:  Meredith Weber is a 63 y.o presenting with lumbar spondylolisthesis and neurogenic claudication status post  L4-S1 MIS TLIF  - mobilize - pain control - DVT prophylaxis - will reevaluate drain output for removal this afternoon - PTOT  Anastacio Karvonen PA-C Department of Neurosurgery

## 2023-11-03 NOTE — TOC Initial Note (Signed)
 Transition of Care [facility]) - Initial/Assessment Note    Patient Details  Name: Meredith Weber MRN: [id number] Date of Birth: Jan 27, 1961  Transition of Care [facility]) CM/SW Contact:    Alvera Singh, RN Phone Number: 11/03/2023, 1:38 PM  Clinical Narrative:                 Patient needs pediatric rolling walker, sent to [facility]. Patient needs home health PT, sent to [facility], awaiting response.   Expected Discharge Plan: Home w Home Health Services Barriers to Discharge: Continued Medical Work up   Patient Goals and CMS Choice            Expected Discharge Plan and Services     Post Acute Care Choice: Durable Medical Equipment, Home Health Living arrangements for the past 2 months: Single Family Home                 DME Arranged: Walker rolling (TLSO brace) DME Agency: [facility] Date DME Agency Contacted: 11/03/23 Time DME Agency Contacted: 1336   HH Arranged: PT HH Agency: [facility] Date HH Agency Contacted: 11/03/23 Time HH Agency Contacted: 1338 Representative spoke with at [facility] Agency: Velna Hatchet  Prior Living Arrangements/Services Living arrangements for the past 2 months: Single Family Home Lives with:: Spouse Patient language and need for interpreter reviewed:: No Do you feel safe going back to the place where you live?: Yes      Need for Family Participation in Patient Care: No (Comment) Care giver support system in place?: Yes (comment) Current home services: DME Criminal Activity/Legal Involvement Pertinent to Current Situation/Hospitalization: No - Comment as needed  Activities of Daily Living   ADL Screening (condition at time of admission) Independently performs ADLs?: Yes (appropriate for developmental age) Is the patient deaf or have difficulty hearing?: No Does the patient have difficulty seeing, even when wearing glasses/contacts?: No Does the patient have difficulty concentrating, remembering, or making decisions?: No  Permission  Sought/Granted                  Emotional Assessment Appearance:: Appears stated age         Psych Involvement: No (comment)  Admission diagnosis:  S/P lumbar fusion [Z98.1] Patient Active Problem List   Diagnosis Date Noted   Spondylolisthesis of lumbar region 11/02/2023   Chronic right-sided low back pain with right-sided sciatica 11/02/2023   Spinal stenosis of lumbar region with neurogenic claudication 11/02/2023   Spinal instability, lumbar 11/02/2023   S/P lumbar fusion 11/02/2023   Microscopic hematuria 06/10/2021   Urge incontinence 06/10/2021   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   Strain of knee 07/23/2019   Benign essential hypertension 11/15/2014   Chest pain, atypical 11/15/2014   Abdominal wall hematoma 11/07/2012   PCP:  Alvina Filbert, MD Pharmacy:   [facility] - [place], [place] - [address] [place] [address] Phone: [phone] Fax: [phone]  [id number] - [place], [place] - [address] AT [facility] [address] [place] [address] Phone: [phone] Fax: [phone]  [id number] [place], [place] - [address] [place] [address] Phone: [phone] Fax: [phone]     Social Drivers of Health (SDOH) Social History: SDOH Screenings   Food Insecurity: No Food Insecurity (11/02/2023)  Housing: Low Risk  (11/02/2023)  Transportation Needs: No Transportation Needs (11/02/2023)  Utilities: Not At Risk (11/02/2023)  Tobacco Use: Medium Risk (11/02/2023)   SDOH Interventions:  Readmission Risk Interventions     No data to display

## 2023-11-03 NOTE — Plan of Care (Signed)
   Problem: Nutrition: Goal: Adequate nutrition will be maintained Outcome: Progressing   Problem: Pain Managment: Goal: General experience of comfort will improve and/or be controlled Outcome: Progressing   Problem: Safety: Goal: Ability to remain free from injury will improve Outcome: Progressing

## 2023-11-03 NOTE — Evaluation (Signed)
 Occupational Therapy Evaluation Patient Details Name: Meredith Weber MRN: [id number] DOB: 06-22-1961 Today's Date: 11/03/2023   History of Present Illness   Pt is a 63 y/o F admitted on 11/02/23 for scheduled L4-S1 TLIF. PMH: essential HTN, claustrophobia, L sided nontraumatic intracerebral hemorrhage, spinal stenosis, stroke (2022).    Clinical Impressions Pt seen for OT evaluation this date, POD#1 from above surgery. Prior to hospital admission, pt was using a SPC for community distances and rollator for household mobility, and requiring PRN assist for ADL 2/2 back pain.  Pt lives with spouse in a single family home with 6 steps with R/L rails. Will have initial 24/7 assist available. Pt completed LB dressing requiring MIN A for threading underwear over feet, MAX A for donning socks, and MIN A for UB/LB bathing while seated EOB. Pt completed several STS transfers with and without RW EOB. Endorsed 6/10 back pain and mild dizziness with positional changes. Not orthostatic. RN notified. Pt educated in back precautions with handout provided, self care skills, bed mobility and functional transfer training, AE/DME for bathing, dressing, and toileting needs, and home/routines modifications and falls prevention strategies to maximize safety and functional independence while minimizing falls risk and maintaining precautions. Pt verbalized understanding. Handout provided to support recall and carry over of learned precautions/techniques for bed mobility, functional transfers, and self care skills. Pt will benefit from additional session while admitted prior to discharge.     If plan is discharge home, recommend the following:   A little help with walking and/or transfers;A little help with bathing/dressing/bathroom;Assist for transportation;Assistance with cooking/housework;Help with stairs or ramp for entrance     Functional Status Assessment   Patient has had a recent decline in their functional  status and demonstrates the ability to make significant improvements in function in a reasonable and predictable amount of time.     Equipment Recommendations   None recommended by OT     Recommendations for Other Services         Precautions/Restrictions   Precautions Precautions: Fall;Back Precaution Booklet Issued: Yes (comment) Required Braces or Orthoses: Spinal Brace Spinal Brace: Thoracolumbosacral orthotic;Applied in sitting position;Other (comment) Spinal Brace Comments: for OOB Restrictions Weight Bearing Restrictions Per Provider Order: No     Mobility Bed Mobility Overal bed mobility: Needs Assistance Bed Mobility: Supine to Sit, Sit to Supine     Supine to sit: HOB elevated, Contact guard Sit to supine: HOB elevated, Contact guard assist   General bed mobility comments: HOB elevated per pt request, cues for technique to improve safety and back guidelines    Transfers Overall transfer level: Needs assistance Equipment used: Rolling walker (2 wheels), None Transfers: Sit to/from Stand Sit to Stand: Supervision, Contact guard assist           General transfer comment: VC for hand placement      Balance Overall balance assessment: Needs assistance (Simultaneous filing. User may not have seen previous data.) Sitting-balance support: No upper extremity supported, Feet supported (Simultaneous filing. User may not have seen previous data.) Sitting balance-Leahy Scale: Good (Simultaneous filing. User may not have seen previous data.)     Standing balance support: Single extremity supported, No upper extremity supported, During functional activity (Simultaneous filing. User may not have seen previous data.) Standing balance-Leahy Scale: Fair (Simultaneous filing. User may not have seen previous data.)  ADL either performed or assessed with clinical judgement   ADL Overall ADL's : Needs assistance/impaired      Grooming: Sitting;Independent;Brushing hair;Applying deodorant;Wash/dry face   Upper Body Bathing: Sitting;Minimal assistance   Lower Body Bathing: Sit to/from stand;Minimal assistance   Upper Body Dressing : Sitting;Set up   Lower Body Dressing: Sit to/from stand;Minimal assistance Lower Body Dressing Details (indicate cue type and reason): MIN A to thread underwear over feet, MAX A for donning socks                     Vision         Perception         Praxis         Pertinent Vitals/Pain Pain Assessment Pain Assessment: 0-10 Pain Score: 6  Pain Location: back Pain Descriptors / Indicators: Aching Pain Intervention(s): Limited activity within patient's tolerance, Monitored during session, Premedicated before session, Repositioned     Extremity/Trunk Assessment Upper Extremity Assessment Upper Extremity Assessment: Overall WFL for tasks assessed   Lower Extremity Assessment Lower Extremity Assessment: Overall WFL for tasks assessed RLE Deficits / Details: residual weakness from past CVA (2/5 ankle dorsiflexion)       Communication Communication Communication: No apparent difficulties   Cognition Arousal: Alert Behavior During Therapy: WFL for tasks assessed/performed Cognition: No apparent impairments                               Following commands: Intact       Cueing  General Comments   Cueing Techniques: Verbal cues  endorsed dizziness with positional changes - sitting BP 122/68 HR 75, standing BP 119/77 HR 89 (Simultaneous filing. User may not have seen previous data.)   Exercises Other Exercises Other Exercises: Pt educated in home/routines modifications, back precautions, falls prevention, AE/DME. Handout provided.   Shoulder Instructions      Home Living Family/patient expects to be discharged to:: Private residence Living Arrangements: Spouse/significant other Available Help at Discharge: Family;Available  PRN/intermittently;Available 24 hours/day Type of Home: House Home Access: Stairs to enter [employer] of Steps: 6 Entrance Stairs-Rails: Left;Right Home Layout: One level     Bathroom Shower/Tub: Tub/shower unit;Walk-in shower   Bathroom Toilet: Standard     Home Equipment: Rollator (4 wheels);Cane - single point;Shower seat;Adaptive equipment;Hand held shower head Adaptive Equipment: Reacher;Long-handled sponge        Prior Functioning/Environment Prior Level of Function : Needs assist             Mobility Comments: amb with SPC short community distances, rollator for household distances; 1 fall recently ADLs Comments: generally independent but more recently requiring PRN assist for ADL and IADL 2/2 back pain; difficulty with prolonged standing >10-29min 2/2 pain    OT Problem List: Decreased strength;Pain;Decreased activity tolerance;Impaired balance (sitting and/or standing);Decreased knowledge of use of DME or AE;Decreased knowledge of precautions   OT Treatment/Interventions: Self-care/ADL training;Therapeutic activities;Therapeutic exercise;DME and/or AE instruction;Balance training;Patient/family education      OT Goals(Current goals can be found in the care plan section)   Acute Rehab OT Goals Patient Stated Goal: go home OT Goal Formulation: With patient Time For Goal Achievement: 11/17/23 Potential to Achieve Goals: Good ADL Goals Pt Will Perform Lower Body Dressing: with modified independence;sit to/from stand;with adaptive equipment Pt Will Transfer to Toilet: with modified independence;ambulating (LRAD) Pt Will Perform Toileting - Clothing Manipulation and hygiene: with modified independence;with adaptive equipment Additional ADL Goal #1:  Pt will complete all aspects of bathing with modified independence, maintaining back precautions, 1/1 opportunity.   OT Frequency:  Min 2X/week    Co-evaluation              AM-PAC OT "6 Clicks"  Daily Activity     Outcome Measure Help from another person eating meals?: None Help from another person taking care of personal grooming?: None Help from another person toileting, which includes using toliet, bedpan, or urinal?: A Little Help from another person bathing (including washing, rinsing, drying)?: A Little Help from another person to put on and taking off regular upper body clothing?: None Help from another person to put on and taking off regular lower body clothing?: A Little 6 Click Score: 21   End of Session Equipment Utilized During Treatment: Rolling walker (2 wheels) Nurse Communication: Mobility status  Activity Tolerance: Patient tolerated treatment well Patient left: in bed;with call bell/phone within reach;with bed alarm set  OT Visit Diagnosis: Other abnormalities of gait and mobility (R26.89);Pain;History of falling (Z91.81) Pain - Right/Left: Right (low back and R hip) Pain - part of body: Hip                Time: 4098-1191 OT Time Calculation (min): 41 min Charges:  OT General Charges $OT Visit: 1 Visit OT Evaluation $OT Eval Low Complexity: 1 Low OT Treatments $Self Care/Home Management : 23-37 mins  Berenda Breaker., MPH, MS, OTR/L ascom [phone] 11/03/23, 11:21 AM

## 2023-11-03 NOTE — TOC Progression Note (Signed)
 Transition of Care [facility]) - Progression Note    Patient Details  Name: ARTHELIA CALLICOTT MRN: [id number] Date of Birth: [birth year]  Transition of Care [facility]) CM/SW Contact  Alexandra Ice, RN Phone Number: 11/03/2023, 3:15 PM  Clinical Narrative:     [facility] able to accept patient for home health PT. [facility] delivered DME.   Expected Discharge Plan: Home w Home Health Services Barriers to Discharge: Continued Medical Work up  Expected Discharge Plan and Services     Post Acute Care Choice: Durable Medical Equipment, Home Health Living arrangements for the past 2 months: Single Family Home                 DME Arranged: Walker rolling (TLSO brace) DME Agency: [facility] Date DME Agency Contacted: 11/03/23 Time DME Agency Contacted: [phone]   HH Arranged: PT HH Agency: [facility] Date HH Agency Contacted: 11/03/23 Time HH Agency Contacted: [phone] Representative spoke with at [facility] Agency: Louanna Rouse   Social Determinants of Health (SDOH) Interventions SDOH Screenings   Food Insecurity: No Food Insecurity (11/02/2023)  Housing: Low Risk  (11/02/2023)  Transportation Needs: No Transportation Needs (11/02/2023)  Utilities: Not At Risk (11/02/2023)  Tobacco Use: Medium Risk (11/02/2023)    Readmission Risk Interventions     No data to display

## 2023-11-03 NOTE — Evaluation (Signed)
 Physical Therapy Evaluation Patient Details Name: Meredith Weber MRN: [id number] DOB: 1960/11/13 Today's Date: 11/03/2023  History of Present Illness  Pt is a 63 y/o F admitted on 11/02/23 for scheduled L4-S1 TLIF. PMH: essential HTN, claustrophobia, L sided nontraumatic intracerebral hemorrhage, spinal stenosis, stroke (2022)  Clinical Impression  Pt seen for PT evaluation with pt agreeable, sister arriving during session. Pt reports prior to admission she was ambulatory with rollator in the home, [facility] outside of the home. On this date, PT educates pt on back precautions & use of TLSO; assists pt with donning TLSO. Pt is able to ambulate with RW & CGA, negotiate 6 steps with R rail & CGA. Pt does well with mobility but is hopeful for one more day before d/c. Will continue to follow pt acutely to progress mobility as able & for ongoing education.        If plan is discharge home, recommend the following: A little help with walking and/or transfers;A little help with bathing/dressing/bathroom;Assistance with cooking/housework;Assist for transportation;Help with stairs or ramp for entrance   Can travel by private vehicle        Equipment Recommendations Rolling walker (2 wheels) (youth RW)  Recommendations for Other Services       Functional Status Assessment Patient has had a recent decline in their functional status and/or demonstrates limited ability to make significant improvements in function in a reasonable and predictable amount of time     Precautions / Restrictions Precautions Precautions: Fall;Back Required Braces or Orthoses: Spinal Brace Spinal Brace: Thoracolumbosacral orthotic;Applied in sitting position;Other (comment) Spinal Brace Comments: for OOB Restrictions Weight Bearing Restrictions Per Provider Order: No      Mobility  Bed Mobility Overal bed mobility: Needs Assistance Bed Mobility: Rolling, Sidelying to Sit, Sit to Sidelying Rolling: Supervision, Used  rails Sidelying to sit: Supervision, Used rails     Sit to sidelying: Supervision, HOB elevated, Used rails General bed mobility comments: education re: log rolling, pt uses bed rails with bed flat, reports she will get a bed rail to use at home    Transfers Overall transfer level: Needs assistance Equipment used: Rolling walker (2 wheels) Transfers: Sit to/from Stand Sit to Stand: Supervision           General transfer comment: education/cuing re: Hand placement to push to standing.    Ambulation/Gait Ambulation/Gait assistance: Contact guard assist Gait Distance (Feet): 80 Feet Assistive device: Rolling walker (2 wheels) Gait Pattern/deviations: Decreased step length - right, Decreased step length - left, Decreased stride length Gait velocity: decreased        Stairs Stairs: Yes Stairs assistance: Contact guard assist Stair Management: One rail Right, Step to pattern, Sideways Number of Stairs: 6 General stair comments: PT provides demo re: stair negotiation with compensatory pattern with pt return demonstrating, CGA for balance.  Wheelchair Mobility     Tilt Bed    Modified Rankin (Stroke Patients Only)       Balance                                             Pertinent Vitals/Pain Pain Assessment Pain Assessment: 0-10 Pain Score: 6  Pain Location: RLE Pain Descriptors / Indicators: Discomfort Pain Intervention(s): Monitored during session    Home Living Family/patient expects to be discharged to:: Private residence Living Arrangements: Spouse/significant other Available Help at Discharge: Family;Available PRN/intermittently;Available 24  hours/day Type of Home: House Home Access: Stairs to enter Entrance Stairs-Rails: Lawyer of Steps: 6   Home Layout: One level Home Equipment: Rollator (4 wheels);Cane - single point;Shower seat;Adaptive equipment;Hand held shower head      Prior Function Prior  Level of Function : Needs assist             Mobility Comments: amb with SPC short community distances, rollator for household distances; 1 fall recently ADLs Comments: generally independent but more recently requiring PRN assist for ADL and IADL 2/2 back pain; difficulty with prolonged standing >10-70min 2/2 pain     Extremity/Trunk Assessment   Upper Extremity Assessment Upper Extremity Assessment: Overall WFL for tasks assessed    Lower Extremity Assessment Lower Extremity Assessment: Overall WFL for tasks assessed RLE Deficits / Details: residual weakness from past CVA (2/5 ankle dorsiflexion)       Communication   Communication Communication: No apparent difficulties    Cognition Arousal: Alert Behavior During Therapy: Anxious, WFL for tasks assessed/performed   PT - Cognitive impairments: No apparent impairments                         Following commands: Intact       Cueing Cueing Techniques: Verbal cues     General Comments General comments (skin integrity, edema, etc.): endorsed dizziness with positional changes - sitting BP 122/68 HR 75, standing BP 119/77 HR 89 (Simultaneous filing. User may not have seen previous data.)    Exercises     Assessment/Plan    PT Assessment Patient needs continued PT services  PT Problem List Decreased strength;Decreased coordination;Pain;Decreased range of motion;Decreased activity tolerance;Decreased balance;Decreased knowledge of precautions;Decreased mobility;Decreased knowledge of use of DME       PT Treatment Interventions DME instruction;Balance training;Modalities;Gait training;Neuromuscular re-education;Stair training;Therapeutic activities;Therapeutic exercise;Functional mobility training;Patient/family education    PT Goals (Current goals can be found in the Care Plan section)  Acute Rehab PT Goals Patient Stated Goal: get better, stay another day PT Goal Formulation: With patient Time For Goal  Achievement: 11/17/23 Potential to Achieve Goals: Good    Frequency 7X/week     Co-evaluation               AM-PAC PT "6 Clicks" Mobility  Outcome Measure Help needed turning from your back to your side while in a flat bed without using bedrails?: A Little Help needed moving from lying on your back to sitting on the side of a flat bed without using bedrails?: A Little Help needed moving to and from a bed to a chair (including a wheelchair)?: A Little Help needed standing up from a chair using your arms (e.g., wheelchair or bedside chair)?: A Little Help needed to walk in hospital room?: A Little Help needed climbing 3-5 steps with a railing? : A Little 6 Click Score: 18    End of Session Equipment Utilized During Treatment: Back brace Activity Tolerance: Patient tolerated treatment well;Patient limited by fatigue Patient left: in chair;with call bell/phone within reach;with family/visitor present Nurse Communication: Mobility status PT Visit Diagnosis: Pain;Muscle weakness (generalized) (M62.81);Difficulty in walking, not elsewhere classified (R26.2) Pain - Right/Left: Right Pain - part of body: Leg    Time: 0981-1914 PT Time Calculation (min) (ACUTE ONLY): 24 min   Charges:   PT Evaluation $PT Eval Low Complexity: 1 Low PT Treatments $Therapeutic Activity: 8-22 mins PT General Charges $$ ACUTE PT VISIT: 1 Visit  Emaline Handsome, PT, DPT 11/03/23, 11:01 AM   Venetta Gill 11/03/2023, 11:00 AM

## 2023-11-04 ENCOUNTER — Other Ambulatory Visit: Payer: Self-pay

## 2023-11-04 MED ORDER — AMLODIPINE BESYLATE 5 MG PO TABS
ORAL_TABLET | ORAL | Status: AC
  Filled 2023-11-04: qty 1

## 2023-11-04 MED ORDER — SENNA 8.6 MG PO TABS
1.0000 | ORAL_TABLET | Freq: Two times a day (BID) | ORAL | 0 refills | Status: DC | PRN
  Filled 2023-11-04: qty 30, 15d supply, fill #0

## 2023-11-04 MED ORDER — OXYCODONE HCL 5 MG PO TABS
5.0000 mg | ORAL_TABLET | ORAL | 0 refills | Status: DC | PRN
Start: 2023-11-04 — End: 2023-11-12
  Filled 2023-11-04: qty 45, 8d supply, fill #0

## 2023-11-04 MED ORDER — METHOCARBAMOL 500 MG PO TABS
500.0000 mg | ORAL_TABLET | Freq: Four times a day (QID) | ORAL | 0 refills | Status: DC | PRN
  Filled 2023-11-04: qty 120, 30d supply, fill #0

## 2023-11-04 MED ORDER — GABAPENTIN 300 MG PO CAPS
ORAL_CAPSULE | ORAL | Status: AC
  Filled 2023-11-04: qty 1

## 2023-11-04 NOTE — TOC Progression Note (Signed)
 Transition of Care [facility]) - Progression Note    Patient Details  Name: CALIEGH MIDDLEKAUFF MRN: [id number] Date of Birth: 01/19/1961  Transition of Care [facility]) CM/SW Contact  Quintella Suzen Jansky, RN Phone Number: 11/04/2023, 9:44 AM  Clinical Narrative:     Patient needs BSC. Sent referral to [employer].   Expected Discharge Plan: Home w Home Health Services Barriers to Discharge: Continued Medical Work up  Expected Discharge Plan and Services     Post Acute Care Choice: Durable Medical Equipment, Home Health Living arrangements for the past 2 months: Single Family Home                 DME Arranged: Walker rolling (TLSO brace) DME Agency: [employer] Date DME Agency Contacted: 11/03/23 Time DME Agency Contacted: [phone]   HH Arranged: PT HH Agency: [facility] Date HH Agency Contacted: 11/03/23 Time HH Agency Contacted: 1338 Representative spoke with at [facility] Agency: Dorothe   Social Determinants of Health (SDOH) Interventions SDOH Screenings   Food Insecurity: No Food Insecurity (11/02/2023)  Housing: Low Risk  (11/02/2023)  Transportation Needs: No Transportation Needs (11/02/2023)  Utilities: Not At Risk (11/02/2023)  Tobacco Use: Medium Risk (11/02/2023)    Readmission Risk Interventions     No data to display

## 2023-11-04 NOTE — Progress Notes (Signed)
 Occupational Therapy Treatment Patient Details Name: Meredith Weber MRN: [id number] DOB: 09/24/1960 Today's Date: 11/04/2023   History of present illness Pt is a 63 y/o F admitted on 11/02/23 for scheduled L4-S1 TLIF. PMH: essential HTN, claustrophobia, L sided nontraumatic intracerebral hemorrhage, spinal stenosis, stroke (2022)   OT comments  Pt received in recliner, spouse present for ADL education session. Pt recalls back precautions start of session, but requires max verbal cuing to adhere during functional task performance. Pt completes UB dressing with SETUP in recliner, LB dressing using AE with CGA, and t/f bathroom for toileting needs using RW with supervision. Spouse and able to return demo donning LSO with min vcs for positioning. Discussed home safety and modifications for fall prevention as pt has animals in the home. Pt and spouse verbalize understanding of all education provided. Pt safe to discharge home from an acute OT standpoint.       If plan is discharge home, recommend the following:  A little help with walking and/or transfers;A little help with bathing/dressing/bathroom;Assist for transportation;Assistance with cooking/housework;Help with stairs or ramp for entrance   Equipment Recommendations  BSC/3in1       Precautions / Restrictions Precautions Precautions: Fall;Back Precaution Booklet Issued: Yes (comment) Recall of Precautions/Restrictions: Intact Required Braces or Orthoses: Spinal Brace Spinal Brace: Other (comment) (LSO) Spinal Brace Comments: for OOB Restrictions Weight Bearing Restrictions Per Provider Order: No       Mobility Bed Mobility Overal bed mobility: Needs Assistance                  Transfers Overall transfer level: Needs assistance Equipment used: Rolling walker (2 wheels) Transfers: Sit to/from Stand Sit to Stand: Supervision                 Balance Overall balance assessment: Needs assistance Sitting-balance  support: No upper extremity supported, Feet supported Sitting balance-Leahy Scale: Good     Standing balance support: Bilateral upper extremity supported, During functional activity, Reliant on assistive device for balance Standing balance-Leahy Scale: Good                             ADL either performed or assessed with clinical judgement   ADL Overall ADL's : Needs assistance/impaired     Grooming: Applying deodorant;Set up;Sitting           Upper Body Dressing : Sitting;Set up Upper Body Dressing Details (indicate cue type and reason): doffs housecoat, dons bra and dress Lower Body Dressing: Sit to/from stand;Contact guard assist Lower Body Dressing Details (indicate cue type and reason): AE used with max vcs for education and instruction for step by step performance. dons underwear Toilet Transfer: Contact guard assist;Ambulation;BSC/3in1;Rolling walker (2 wheels);Regular Teacher, adult education Details (indicate cue type and reason): good carryover of taught technique Toileting- Clothing Manipulation and Hygiene: Contact guard assist;Sit to/from stand Toileting - Clothing Manipulation Details (indicate cue type and reason): recommending tongs/bidet/bottom buddy for hygeine mgmt     Functional mobility during ADLs: Contact guard assist;Rolling walker (2 wheels)       Communication Communication Communication: No apparent difficulties   Cognition Arousal: Alert Behavior During Therapy: Anxious, WFL for tasks assessed/performed Cognition: No apparent impairments                               Following commands: Intact        Cueing   Cueing  Techniques: Verbal cues  Exercises Other Exercises Other Exercises: Pt educated in home/routines modifications, back precautions, falls prevention, AE/DME. Handout provided. Spouse assists with donning LSO seated in recliner, min vcs.            Pertinent Vitals/ Pain       Pain Assessment Pain  Assessment: 0-10 Pain Score: 2  Pain Location: back R side > L side Pain Descriptors / Indicators: Discomfort Pain Intervention(s): Limited activity within patient's tolerance         Frequency  Min 2X/week        Progress Toward Goals  OT Goals(current goals can now be found in the care plan section)  Progress towards OT goals: Progressing toward goals  Acute Rehab OT Goals OT Goal Formulation: With patient Time For Goal Achievement: 11/17/23 Potential to Achieve Goals: Good ADL Goals Pt Will Perform Lower Body Dressing: with modified independence;sit to/from stand;with adaptive equipment Pt Will Transfer to Toilet: with modified independence;ambulating Pt Will Perform Toileting - Clothing Manipulation and hygiene: with modified independence;with adaptive equipment Additional ADL Goal #1: Pt will complete all aspects of bathing with modified independence, maintaining back precautions, 1/1 opportunity.  Plan         AM-PAC OT "6 Clicks" Daily Activity     Outcome Measure   Help from another person eating meals?: None Help from another person taking care of personal grooming?: None Help from another person toileting, which includes using toliet, bedpan, or urinal?: A Little Help from another person bathing (including washing, rinsing, drying)?: A Little Help from another person to put on and taking off regular upper body clothing?: None Help from another person to put on and taking off regular lower body clothing?: A Little 6 Click Score: 21    End of Session Equipment Utilized During Treatment: Rolling walker (2 wheels);Back brace  OT Visit Diagnosis: Other abnormalities of gait and mobility (R26.89);Pain;History of falling (Z91.81)   Activity Tolerance Patient tolerated treatment well   Patient Left in chair;with call bell/phone within reach;with family/visitor present   Nurse Communication Mobility status        Time: 4098-1191 OT Time Calculation (min):  50 min  Charges: OT General Charges $OT Visit: 1 Visit OT Treatments $Self Care/Home Management : 38-52 mins  Yamaris Cummings L. Santos Sollenberger, OTR/L  11/04/23, 12:42 PM

## 2023-11-04 NOTE — Progress Notes (Signed)
 Nsg Discharge Note  Admit Date:  11/02/2023 Discharge date: 11/04/2023   Meredith Weber to be D/C'd Home per MD order.  AVS completed.   Patient/caregiver able to verbalize understanding.  Discharge Medication: Allergies as of 11/04/2023       Reactions   Cashew Nut Oil Anaphylaxis, Rash, Other (See Comments)        Medication List     STOP taking these medications    traMADol  50 MG tablet Commonly known as: Ultram        TAKE these medications    Advil Dual Action 125-250 MG Tabs Generic drug: Ibuprofen-Acetaminophen  Take 2 tablets by mouth 2 (two) times daily as needed (pain).   amLODipine  2.5 MG tablet Commonly known as: NORVASC  1 tablet Orally Once a day for 30 days   atorvastatin  20 MG tablet Commonly known as: LIPITOR Take 20 mg by mouth daily.   chlorhexidine  4 % external liquid Commonly known as: HIBICLENS  Apply 15 mLs (1 Application total) topically as directed for 30 doses. Use as directed daily for 5 days every other week for 6 weeks.   fluticasone  50 MCG/ACT nasal spray Commonly known as: FLONASE  Place 1 spray into both nostrils daily for 14 days. What changed:  when to take this reasons to take this   gabapentin  300 MG capsule Commonly known as: Neurontin  Take 1 capsule (300 mg total) by mouth 3 (three) times daily.   hydrochlorothiazide  12.5 MG tablet Commonly known as: HYDRODIURIL  Take 12.5 mg by mouth daily.   methocarbamol  500 MG tablet Commonly known as: ROBAXIN  Take 1 tablet (500 mg total) by mouth every 6 (six) hours as needed for muscle spasms.   mupirocin  ointment 2 % Commonly known as: BACTROBAN  Place 1 Application into the nose 2 (two) times daily for 60 doses. Use as directed 2 times daily for 5 days every other week for 6 weeks.   oxyCODONE  5 MG immediate release tablet Commonly known as: Oxy IR/ROXICODONE  Take 1-2 tablets (5-10 mg total) by mouth every 4 (four) hours as needed for up to 5 days for moderate pain (pain score  4-6).   pseudoephedrine  30 MG tablet Commonly known as: SUDAFED Take 30 mg by mouth daily as needed for congestion.   senna 8.6 MG Tabs tablet Commonly known as: SENOKOT Take 1 tablet (8.6 mg total) by mouth 2 (two) times daily as needed for mild constipation.               Durable Medical Equipment  (From admission, onward)           Start     Ordered   11/04/23 0935  For home use only DME Bedside commode  Once       Question:  Patient needs a bedside commode to treat with the following condition  Answer:  Spondylolisthesis of lumbar region   11/04/23 0935   11/03/23 1332  For home use only DME Walker rolling  Once       Question Answer Comment  Walker: With 5 Inch Wheels   Patient needs a walker to treat with the following condition Spondylolisthesis of lumbar region      11/03/23 1332            Discharge Assessment: Vitals:   11/04/23 0820 11/04/23 1128  BP: 135/76 112/75  Pulse: 83 71  Resp: 16 15  Temp: 99.2 F (37.3 C) 98.7 F (37.1 C)  SpO2: 100% 98%   Skin clean, dry and intact without evidence of  skin break down, no evidence of skin tears noted. IV catheter discontinued intact. Site without signs and symptoms of complications - no redness or edema noted at insertion site, patient denies c/o pain - only slight tenderness at site.  Dressing with slight pressure applied.  D/c Instructions-Education: Discharge instructions given to patient/family with verbalized understanding. D/c education completed with patient/family including follow up instructions, medication list, d/c activities limitations if indicated, with other d/c instructions as indicated by MD - patient able to verbalize understanding, all questions fully answered. Patient instructed to return to ED, call 911, or call MD for any changes in condition.  Patient escorted via WC, and D/C home via private auto.  Lamarr Pilling, RN 11/04/2023 4:07 PM

## 2023-11-04 NOTE — Plan of Care (Signed)
 Documented

## 2023-11-04 NOTE — Discharge Summary (Signed)
 Physician Discharge Summary  Patient ID: Meredith Weber MRN: [id number] DOB/AGE: 1961-02-17 63 y.o.  Admit date: 11/02/2023 Discharge date: 11/04/2023  Admission Diagnoses:    Spondylolisthesis of lumbar region   Chronic right-sided low back pain with right-sided sciatica   Spinal stenosis of lumbar region with neurogenic claudication   Spinal instability, lumbar  Discharge Diagnoses:  Principal Problem:   S/P lumbar fusion Active Problems:   Spondylolisthesis of lumbar region   Chronic right-sided low back pain with right-sided sciatica   Spinal stenosis of lumbar region with neurogenic claudication   Spinal instability, lumbar   Discharged Condition: good  Hospital Course: Meredith Weber was admitted for surgical intervention.  She did well with surgery and was evaluated by PT and OT until deemed appropriate for discharge.  Consults: None  Significant Diagnostic Studies: radiology: X-Ray: Confirmation of implant placement  Treatments: surgery: L4-S1 minimally invasive transforaminal lumbar interbody fusion  Discharge Exam: Blood pressure 135/76, pulse 83, temperature 99.2 F (37.3 C), temperature source Temporal, resp. rate 16, height 4' 10 (1.473 m), weight 90.1 kg, SpO2 100%. General appearance: alert and cooperative CNI MAEW with R DF weakness (baseline)  Disposition: Discharge disposition: 01-Home or Self Care       Discharge Instructions     Ambulatory referral to Physical Therapy   Complete by: As directed    Iontophoresis - 4 mg/ml of dexamethasone : No   T.E.N.S. Unit Evaluation and Dispense as Indicated: Yes   Incentive spirometry RT   Complete by: As directed       Allergies as of 11/04/2023       Reactions   Cashew Nut Oil Anaphylaxis, Rash, Other (See Comments)        Medication List     STOP taking these medications    traMADol  50 MG tablet Commonly known as: Ultram        TAKE these medications    Advil Dual Action 125-250 MG  Tabs Generic drug: Ibuprofen-Acetaminophen  Take 2 tablets by mouth 2 (two) times daily as needed (pain).   amLODipine  2.5 MG tablet Commonly known as: NORVASC  1 tablet Orally Once a day for 30 days   atorvastatin  20 MG tablet Commonly known as: LIPITOR Take 20 mg by mouth daily.   chlorhexidine  4 % external liquid Commonly known as: HIBICLENS  Apply 15 mLs (1 Application total) topically as directed for 30 doses. Use as directed daily for 5 days every other week for 6 weeks.   fluticasone  50 MCG/ACT nasal spray Commonly known as: FLONASE  Place 1 spray into both nostrils daily for 14 days. What changed:  when to take this reasons to take this   gabapentin  300 MG capsule Commonly known as: Neurontin  Take 1 capsule (300 mg total) by mouth 3 (three) times daily.   hydrochlorothiazide  12.5 MG tablet Commonly known as: HYDRODIURIL  Take 12.5 mg by mouth daily.   methocarbamol  500 MG tablet Commonly known as: ROBAXIN  Take 1 tablet (500 mg total) by mouth every 6 (six) hours as needed for muscle spasms.   mupirocin  ointment 2 % Commonly known as: BACTROBAN  Place 1 Application into the nose 2 (two) times daily for 60 doses. Use as directed 2 times daily for 5 days every other week for 6 weeks.   oxyCODONE  5 MG immediate release tablet Commonly known as: Oxy IR/ROXICODONE  Take 1-2 tablets (5-10 mg total) by mouth every 4 (four) hours as needed for up to 5 days for moderate pain (pain score 4-6).   pseudoephedrine  30 MG tablet Commonly  known as: SUDAFED Take 30 mg by mouth daily as needed for congestion.   senna 8.6 MG Tabs tablet Commonly known as: SENOKOT Take 1 tablet (8.6 mg total) by mouth 2 (two) times daily as needed for mild constipation.               Durable Medical Equipment  (From admission, onward)           Start     Ordered   11/04/23 0935  For home use only DME Bedside commode  Once       Question:  Patient needs a bedside commode to treat with  the following condition  Answer:  Spondylolisthesis of lumbar region   11/04/23 0935   11/03/23 1332  For home use only DME Walker rolling  Once       Question Answer Comment  Walker: With 5 Inch Wheels   Patient needs a walker to treat with the following condition Spondylolisthesis of lumbar region      11/03/23 1332            Follow-up Information     Hilma Hastings, PA-C Follow up on 11/17/2023.   Specialty: Neurosurgery Contact information: [address] [place] [address] [phone]                 Signed: REEVES DAISY 11/04/2023, 10:37 AM

## 2023-11-04 NOTE — TOC Transition Note (Signed)
 Transition of Care [facility]) - Discharge Note   Patient Details  Name: KALENNA MILLETT MRN: [id number] Date of Birth: [birth year]  Transition of Care [facility]) CM/SW Contact:  Alexandra Ice, RN Phone Number: 11/04/2023, 11:51 AM   Clinical Narrative:    Patient to discharge today. [facility] set up with surgeon office prior to surgery. [employer] delivered DME to bedside.      Barriers to Discharge: Continued Medical Work up   Patient Goals and CMS Choice            Discharge Placement                       Discharge Plan and Services Additional resources added to the After Visit Summary for       Post Acute Care Choice: Durable Medical Equipment, Home Health          DME Arranged: Walker rolling (TLSO brace) DME Agency: [facility] Date DME Agency Contacted: 11/03/23 Time DME Agency Contacted: 1336   HH Arranged: PT HH Agency: [facility] Date HH Agency Contacted: 11/03/23 Time HH Agency Contacted: 1338 Representative spoke with at [facility] Agency: Louanna Rouse  Social Drivers of Health (SDOH) Interventions SDOH Screenings   Food Insecurity: No Food Insecurity (11/02/2023)  Housing: Low Risk  (11/02/2023)  Transportation Needs: No Transportation Needs (11/02/2023)  Utilities: Not At Risk (11/02/2023)  Tobacco Use: Medium Risk (11/02/2023)     Readmission Risk Interventions     No data to display

## 2023-11-04 NOTE — Progress Notes (Signed)
 Physical Therapy Treatment Patient Details Name: Meredith Weber MRN: [id number] DOB: 01/03/1961 Today's Date: 11/04/2023   History of Present Illness Pt is a 63 y/o F admitted on 11/02/23 for scheduled L4-S1 TLIF. PMH: essential HTN, claustrophobia, L sided nontraumatic intracerebral hemorrhage, spinal stenosis, stroke (2022)    PT Comments  Pt is alert and oriented. Agreeable to session and cooperative throughout. Supportive spouse present throughout session. Pt was able to safely exit bed (applied LSO while seated EOB) prior to standing and ambulating > 200 ft with RW.  Safely performed ascending/descending stairs without safety concerns. Author reviewed car transfers, brace application and use, and need for continued routine mobility. Pt prefers to Dc directly from acute PT to OPPT. MD aware. Pt is clered form ana cute PT standpoint for safe DC home with OPPT to follow.    If plan is discharge home, recommend the following: A little help with walking and/or transfers;A little help with bathing/dressing/bathroom;Assistance with cooking/housework;Assist for transportation;Help with stairs or ramp for entrance     Equipment Recommendations  Other (comment);BSC/3in1 (pt has recieved RW already)       Precautions / Restrictions Precautions Precautions: Fall;Back Precaution Booklet Issued: Yes (comment) Recall of Precautions/Restrictions: Intact Required Braces or Orthoses: Spinal Brace Spinal Brace: Other (comment) (LSO) Spinal Brace Comments: for OOB Restrictions Weight Bearing Restrictions Per Provider Order: No     Mobility  Bed Mobility Overal bed mobility: Needs Assistance Bed Mobility: Rolling, Sidelying to Sit, Sit to Sidelying Rolling: Supervision, Used rails Sidelying to sit: Supervision, Used rails Supine to sit: Supervision, Used rails   Transfers Overall transfer level: Needs assistance Equipment used: Rolling walker (2 wheels) Transfers: Sit to/from Stand Sit to  Stand: Supervision   Ambulation/Gait Ambulation/Gait assistance: Supervision Gait Distance (Feet): 200 Feet Assistive device: Rolling walker (2 wheels) Gait Pattern/deviations: Step-through pattern  General Gait Details: no LOB or safety concerns with ambulation with use of RW   Stairs Stairs: Yes Stairs assistance: Supervision Stair Management: One rail Right, Step to pattern, Sideways Number of Stairs: 6     Balance Overall balance assessment: Needs assistance Sitting-balance support: No upper extremity supported, Feet supported Sitting balance-Leahy Scale: Good     Standing balance support: Bilateral upper extremity supported, During functional activity, Reliant on assistive device for balance Standing balance-Leahy Scale: Good      Communication Communication Communication: No apparent difficulties  Cognition Arousal: Alert Behavior During Therapy: Anxious, WFL for tasks assessed/performed   PT - Cognitive impairments: No apparent impairments      PT - Cognition Comments: Pt is A and O x 4. supportive spouse present throughout session Following commands: Intact      Cueing Cueing Techniques: Verbal cues         Pertinent Vitals/Pain Pain Assessment Pain Assessment: 0-10 Pain Score: 4  Pain Location: back R side > L side Pain Descriptors / Indicators: Discomfort Pain Intervention(s): Limited activity within patient's tolerance, Monitored during session, Premedicated before session, Repositioned, Ice applied     PT Goals (current goals can now be found in the care plan section) Acute Rehab PT Goals Patient Stated Goal: go home Progress towards PT goals: Progressing toward goals    Frequency    7X/week       AM-PAC PT "6 Clicks" Mobility   Outcome Measure  Help needed turning from your back to your side while in a flat bed without using bedrails?: A Little Help needed moving from lying on your back to sitting on the side  of a flat bed without  using bedrails?: A Little Help needed moving to and from a bed to a chair (including a wheelchair)?: A Little Help needed standing up from a chair using your arms (e.g., wheelchair or bedside chair)?: A Little Help needed to walk in hospital room?: A Little Help needed climbing 3-5 steps with a railing? : A Little 6 Click Score: 18    End of Session Equipment Utilized During Treatment: Back brace Activity Tolerance: Patient tolerated treatment well;Patient limited by fatigue Patient left: in chair;with call bell/phone within reach;with family/visitor present Nurse Communication: Mobility status PT Visit Diagnosis: Pain;Muscle weakness (generalized) (M62.81);Difficulty in walking, not elsewhere classified (R26.2) Pain - Right/Left: Right Pain - part of body: Leg     Time: 1610-9604 PT Time Calculation (min) (ACUTE ONLY): 29 min  Charges:    $Gait Training: 8-22 mins $Therapeutic Activity: 8-22 mins PT General Charges $$ ACUTE PT VISIT: 1 Visit                    Chester Costa PTA 11/04/23, 10:56 AM

## 2023-11-04 NOTE — TOC CM/SW Note (Cosign Needed)
 Patient is not able to walk the distance required to go the bathroom, or he/she is unable to safely negotiate stairs required to access the bathroom.  A 3in1 BSC will alleviate this problem

## 2023-11-04 NOTE — Progress Notes (Signed)
   REFERRING PHYSICIAN:  Twylla Galen, Md [address] [place],  [place] [address]  DOS: 11/02/23  MIS L4-S1 TLIF  HISTORY OF PRESENT ILLNESS: Meredith Weber is approximately 2 weeks status post above surgery. Was given oxycodone  and robaxin  on discharge from the hospital.  She states she has had pain in her back and buttock bilaterally that she describes as an achy pain but that this is not as severe as it was pre-op. She is working with therapy and slowly increasing her as tolerated.  She asked about resuming her home ibuprofen.  She is taking oxycodone  every 6-8 hours as needed as well as Robaxin .  She denies any incisional concerns.  Overall she feels improved from preop.   Preop nasal swab was positive for staph- patient is to continue mupirocin  and chloraprep x 6 weeks.    PHYSICAL EXAMINATION:  General: Patient is well developed, well nourished, calm, collected, and in no apparent distress.   NEUROLOGICAL:  General: In no acute distress.   Awake, alert, oriented to person, place, and time.  Pupils equal round and reactive to light.  Facial tone is symmetric.     Strength:            Side Iliopsoas Quads Hamstring PF DF EHL  R 5 5 5 4 5  4+  L 5 5 5 5 5 5    Incision c/d/i   ROS (Neurologic):  Negative except as noted above  IMAGING: Nothing new to review.   ASSESSMENT/PLAN:  Meredith Weber is doing well s/p above surgery. Treatment options reviewed with patient and following plan made:   - I have advised the patient to lift up to 10 pounds until 6 weeks after surgery (follow up with Dr. Mont Antis).  - Reviewed wound care.  - No bending, twisting, or lifting.  - Continue on current medications including Oxycodone  and Robaxin . Recommended against Ibuprofen but did send in a prescription for Celebrex  to take as needed.  - Follow up as scheduled in 4 weeks and prn.   Advised to contact the office if any questions or concerns arise.  Meredith Karvonen  PA-C Department of neurosurgery

## 2023-11-04 NOTE — Progress Notes (Signed)
   Neurosurgery Progress Note  History: Meredith Weber is s/p L4-S1 MIS TLIF  POD2: Doing well.  Back pain improved. POD1: Complaining of right buttock/ hip pain similar to pre-op. She is otherwise doing well  Physical Exam: Vitals:   11/03/23 2300 11/04/23 0820  BP: 112/75 135/76  Pulse:  83  Resp: 15 16  Temp: 99 F (37.2 C) 99.2 F (37.3 C)  SpO2: 95% 100%    AA Ox3 CNI  Strength:5/5 throughout BLE except 4- right HF   Data:  Other tests/results: NA  Assessment/Plan:  Meredith Weber is a 63 y.o presenting with lumbar spondylolisthesis and neurogenic claudication status post  L4-S1 MIS TLIF  - mobilize - pain control - DVT prophylaxis - PTOT  Jodeen Munch MD Department of Neurosurgery

## 2023-11-07 ENCOUNTER — Telehealth: Payer: Self-pay | Admitting: Neurosurgery

## 2023-11-07 NOTE — Telephone Encounter (Signed)
 Referral sent to [facility] in [place].

## 2023-11-07 NOTE — Telephone Encounter (Signed)
 Patient called the office about her physical therapy. I asked her about the leg pain. She said that she was able to take to the doctor on call and the pain is better. Regarding her physical therapy. Meredith Weber can not get her started with PT until 12/07/2023. She wants to make sure that it is ok with Dr.Yarbrough for her to wait that long or should she try to get in somewhere else?

## 2023-11-07 NOTE — Telephone Encounter (Signed)
  Media Information   Document Information  L4-S1 MIS TLIF on 11/02/23

## 2023-11-08 ENCOUNTER — Telehealth: Payer: Self-pay | Admitting: Neurosurgery

## 2023-11-08 NOTE — Telephone Encounter (Signed)
 I called back Duwan, I passed on our restrictions for post-op spine as listed below.   No bending, lifting, or twisting ("BLT"). Avoid lifting objects heavier than 10 pounds for the first 6 weeks after surgery. Weeks 6 through 12 after surgery: avoid lifting more than 25 pounds. After 12 weeks increase activity as tolerated.

## 2023-11-08 NOTE — Telephone Encounter (Signed)
 L4-S1 MIS TLIF on 11/02/23   Duwan from [facility] is calling to find out if the patient has any restrictions that need to be noted. Please advise.

## 2023-11-10 ENCOUNTER — Other Ambulatory Visit: Payer: Self-pay | Admitting: Neurosurgery

## 2023-11-10 ENCOUNTER — Telehealth: Payer: Self-pay | Admitting: Neurosurgery

## 2023-11-10 MED ORDER — GABAPENTIN 300 MG PO CAPS: 300.0000 mg | ORAL_CAPSULE | Freq: Three times a day (TID) | ORAL | 0 refills | Status: DC

## 2023-11-10 MED ORDER — OXYCODONE HCL 5 MG PO TABS
5.0000 mg | ORAL_TABLET | Freq: Four times a day (QID) | ORAL | 0 refills | Status: DC | PRN
Start: 2023-11-10 — End: 2023-11-11

## 2023-11-10 NOTE — Progress Notes (Signed)
PDMP reviewed and appropriate

## 2023-11-10 NOTE — Telephone Encounter (Signed)
 Last rx was a 30 day supply on 10/11/23 Refill has been sent

## 2023-11-10 NOTE — Telephone Encounter (Signed)
 She is calling back she forgot to request oxycodone  5mg  1-2 every 4 hours. [facility] in [place]

## 2023-11-10 NOTE — Telephone Encounter (Signed)
 Patient is calling to request a refill of Gabapentin .  [facility] in [place]

## 2023-11-11 ENCOUNTER — Other Ambulatory Visit: Payer: Self-pay | Admitting: Neurosurgery

## 2023-11-11 MED ORDER — OXYCODONE HCL 5 MG PO TABS: 5.0000 mg | ORAL_TABLET | Freq: Four times a day (QID) | ORAL | 0 refills | Status: DC | PRN

## 2023-11-11 MED ORDER — OXYCODONE HCL 5 MG PO TABS
5.0000 mg | ORAL_TABLET | Freq: Four times a day (QID) | ORAL | 0 refills | Status: AC | PRN
Start: 2023-11-11 — End: 2023-11-16

## 2023-11-11 NOTE — Telephone Encounter (Signed)
 Patient is calling that the [facility] in [place] did not receive her refill request. Can you resend it please. It looks like it was put in as print.

## 2023-11-14 NOTE — Telephone Encounter (Signed)
 I resent the order for oxycodone . I called the pharmacy and they had received the refill as of 12/11/2023. They have a policy that the must call for the refill before it will be filled.   I called the patient and this is medication is being refilled. She is out of medication as of last night. I apologized to the patient about the miscommunication and she will get her medication this afternoon.

## 2023-11-14 NOTE — Telephone Encounter (Signed)
 Patient is calling that her pharmacy did not receive rx yet. Can you check on that please.

## 2023-11-17 ENCOUNTER — Encounter: Payer: Self-pay | Admitting: Orthopedic Surgery

## 2023-11-17 ENCOUNTER — Ambulatory Visit: Payer: Medicare (Managed Care) | Admitting: Neurosurgery

## 2023-11-17 VITALS — BP 144/102 | Temp 98.6°F | Ht <= 58 in | Wt 198.0 lb

## 2023-11-17 DIAGNOSIS — M4316 Spondylolisthesis, lumbar region: Secondary | ICD-10-CM

## 2023-11-17 DIAGNOSIS — Z981 Arthrodesis status: Secondary | ICD-10-CM

## 2023-11-17 MED ORDER — CELECOXIB 200 MG PO CAPS: 200.0000 mg | ORAL_CAPSULE | Freq: Two times a day (BID) | ORAL | 0 refills | Status: DC

## 2023-12-07 ENCOUNTER — Ambulatory Visit (HOSPITAL_COMMUNITY): Payer: Self-pay

## 2023-12-07 ENCOUNTER — Telehealth: Payer: Self-pay | Admitting: Neurosurgery

## 2023-12-07 NOTE — Telephone Encounter (Signed)
 Prescription Request  12/07/2023  LOV: 09/29/2023  What is the name of the medication or equipment?  GABAPENTIN  CELECOXIB  OXYCODONE  METHOCARBAMOL   Have you contacted your pharmacy to request a refill? No   Which pharmacy would you like this sent to?    [facility] - [place], [place] - [address] Phone: [phone]  Fax: [phone]      Patient notified that their request is being sent to the clinical staff for review and that they should receive a response within 2 business days.   Please advise at [facility] [phone]

## 2023-12-08 ENCOUNTER — Other Ambulatory Visit: Payer: Self-pay | Admitting: Neurosurgery

## 2023-12-08 MED ORDER — METHOCARBAMOL 500 MG PO TABS
500.0000 mg | ORAL_TABLET | Freq: Four times a day (QID) | ORAL | 0 refills | Status: DC | PRN
Start: 2023-12-08 — End: 2024-01-23

## 2023-12-08 MED ORDER — CELECOXIB 200 MG PO CAPS: 200.0000 mg | ORAL_CAPSULE | Freq: Two times a day (BID) | ORAL | 0 refills | Status: DC

## 2023-12-08 MED ORDER — OXYCODONE HCL 5 MG PO TABS: 5.0000 mg | ORAL_TABLET | Freq: Four times a day (QID) | ORAL | 0 refills | Status: AC | PRN

## 2023-12-08 MED ORDER — GABAPENTIN 300 MG PO CAPS: 300.0000 mg | ORAL_CAPSULE | Freq: Three times a day (TID) | ORAL | 0 refills | Status: DC

## 2023-12-08 NOTE — Telephone Encounter (Signed)
 Patient has been notified about the prescriptions and also about decreasing the oxycodone . She verbalized understanding on all.

## 2023-12-14 ENCOUNTER — Other Ambulatory Visit: Payer: Self-pay | Admitting: Family Medicine

## 2023-12-14 DIAGNOSIS — M4316 Spondylolisthesis, lumbar region: Secondary | ICD-10-CM

## 2023-12-15 ENCOUNTER — Telehealth: Payer: Self-pay | Admitting: Neurosurgery

## 2023-12-15 ENCOUNTER — Ambulatory Visit
Admission: RE | Admit: 2023-12-15 | Discharge: 2023-12-15 | Disposition: A | Discharge status: Home / Self Care | Payer: Medicare (Managed Care) | Attending: Neurosurgery | Admitting: Neurosurgery

## 2023-12-15 ENCOUNTER — Ambulatory Visit
Admission: RE | Admit: 2023-12-15 | Discharge: 2023-12-15 | Disposition: A | Discharge status: Home / Self Care | Payer: Medicare (Managed Care) | Source: Ambulatory Visit | Attending: Neurosurgery | Admitting: Neurosurgery

## 2023-12-15 ENCOUNTER — Ambulatory Visit (INDEPENDENT_AMBULATORY_CARE_PROVIDER_SITE_OTHER): Payer: Medicare (Managed Care) | Admitting: Neurosurgery

## 2023-12-15 ENCOUNTER — Encounter: Payer: Self-pay | Admitting: Neurosurgery

## 2023-12-15 VITALS — BP 130/88 | Ht <= 58 in | Wt 198.0 lb

## 2023-12-15 DIAGNOSIS — R29898 Other symptoms and signs involving the musculoskeletal system: Secondary | ICD-10-CM

## 2023-12-15 DIAGNOSIS — G8929 Other chronic pain: Secondary | ICD-10-CM

## 2023-12-15 DIAGNOSIS — M5441 Lumbago with sciatica, right side: Secondary | ICD-10-CM

## 2023-12-15 DIAGNOSIS — M4316 Spondylolisthesis, lumbar region: Secondary | ICD-10-CM | POA: Insufficient documentation

## 2023-12-15 DIAGNOSIS — Z09 Encounter for follow-up examination after completed treatment for conditions other than malignant neoplasm: Secondary | ICD-10-CM

## 2023-12-15 DIAGNOSIS — R200 Anesthesia of skin: Secondary | ICD-10-CM

## 2023-12-15 NOTE — Telephone Encounter (Addendum)
 L4-S1 MIS TLIF on 11/02/23 Patient was seen today and forgot to mention that she has tingling, numbness, and aching in her left elbow down into her hand. She has pain in her hand more then her arm. The pain sometimes wakes her up at night. Would this be a side effect to surgery and she just needs to give it time?

## 2023-12-15 NOTE — Progress Notes (Signed)
   REFERRING PHYSICIAN:  Twylla Galen, Md [address] [place],  [place] [address]  DOS: 11/02/23  MIS L4-S1 TLIF  HISTORY OF PRESENT ILLNESS: Meredith Weber is status post above surgery.   She is improving.  Her pain is better - working on strength.    PHYSICAL EXAMINATION:  Vitals:   12/15/23 1131  BP: 130/88    General: Patient is well developed, well nourished, calm, collected, and in no apparent distress.   NEUROLOGICAL:  General: In no acute distress.   Awake, alert, oriented to person, place, and time.  Pupils equal round and reactive to light.  Facial tone is symmetric.     Strength:            Side Iliopsoas Quads Hamstring PF DF EHL  R 5 5 5 4 5  4+  L 5 5 5 5 5 5    Incision c/d/i   ROS (Neurologic):  Negative except as noted above  IMAGING: Nothing new to review.   ASSESSMENT/PLAN:  Meredith Weber is doing well s/p above surgery.    Due to weakness, continue PT for now  Restrictions reviewed.  Meredith Munch MD Department of neurosurgery

## 2023-12-15 NOTE — Telephone Encounter (Signed)
 Left message on her personal voicemail to return call to discuss.

## 2023-12-16 NOTE — Telephone Encounter (Signed)
 I called patient back.   She is agreeable to a referral to [facility] for a EMG   She would like me to contact [facility] to get her a quote or idea of what she should expect to pay.

## 2023-12-16 NOTE — Telephone Encounter (Signed)
 Order sent to [facility] to schedule EMG.   She wants to have a look into other options for a brace. She will contact us  if she wants to move forward with [facility].

## 2023-12-23 ENCOUNTER — Encounter: Payer: Self-pay | Admitting: Neurosurgery

## 2023-12-23 NOTE — Telephone Encounter (Signed)
 [facility]- they will give the patient a call

## 2024-01-13 ENCOUNTER — Other Ambulatory Visit: Payer: Self-pay | Admitting: Physician Assistant

## 2024-01-13 ENCOUNTER — Telehealth: Payer: Self-pay | Admitting: Physician Assistant

## 2024-01-13 MED ORDER — CELECOXIB 200 MG PO CAPS: 200.0000 mg | ORAL_CAPSULE | Freq: Two times a day (BID) | ORAL | 0 refills | Status: DC

## 2024-01-13 NOTE — Telephone Encounter (Signed)
 L4-S1 MIS TLIF on 11/02/23 yarbrough Celecoxib  200mg  2 daily [facility] in [place] Postop with Glade 01/24/2024

## 2024-01-13 NOTE — Telephone Encounter (Signed)
Patient notified of refills.

## 2024-01-16 ENCOUNTER — Other Ambulatory Visit: Payer: Self-pay | Admitting: Neurosurgery

## 2024-01-20 ENCOUNTER — Other Ambulatory Visit: Payer: Self-pay | Admitting: Orthopedic Surgery

## 2024-01-20 DIAGNOSIS — M4316 Spondylolisthesis, lumbar region: Secondary | ICD-10-CM

## 2024-01-20 DIAGNOSIS — Z981 Arthrodesis status: Secondary | ICD-10-CM

## 2024-01-20 NOTE — Progress Notes (Unsigned)
   REFERRING PHYSICIAN:  Katrinka Aquas, Md [address] [place],  [place] [address]  DOS: 11/02/23  MIS L4-S1 TLIF  HISTORY OF PRESENT ILLNESS: She was doing well at last visit. Still had some weakness in right foot and was working on this in PT.   EMG ordered after last visit due to numbness and pain in right elbow to hand.    She is improving.  Her pain is better - working on strength.    PHYSICAL EXAMINATION:  There were no vitals filed for this visit.   General: Patient is well developed, well nourished, calm, collected, and in no apparent distress.   NEUROLOGICAL:  General: In no acute distress.   Awake, alert, oriented to person, place, and time.  Pupils equal round and reactive to light.  Facial tone is symmetric.     Strength:            Side Iliopsoas Quads Hamstring PF DF EHL  R 5 5 5  4*** 5 4+***  L 5 5 5 5 5 5    Incision c/d/i   ROS (Neurologic):  Negative except as noted above  IMAGING: Lumbar xrays dated ***:  No complications noted. ***  Report for above xrays not yet available.    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 2) There is no electrodiagnostic evidence of ulnar neuropathy.  Thank you for the referral of this patient. It was our privilege to participate in care of your patient. Feel free to contact us  with any further questions.  _____________________________ Jannett Fairly, M.D.    ASSESSMENT/PLAN:  Meredith Weber is doing well s/p above surgery. Treatment options reviewed with patient and following plan made:   - Can slowly return to activity as tolerated.  - Referral to Dr. Claudene for left CTS?*** - Follow up with Dr. Clois in 6 months with repeat xrays.   Advised to contact the office if any questions or concerns arise.  Glade Boys PA-C Department of  neurosurgery

## 2024-01-23 ENCOUNTER — Telehealth: Payer: Self-pay | Admitting: Neurosurgery

## 2024-01-23 ENCOUNTER — Other Ambulatory Visit: Payer: Self-pay | Admitting: Neurosurgery

## 2024-01-23 MED ORDER — METHOCARBAMOL 500 MG PO TABS: 500.0000 mg | ORAL_TABLET | Freq: Three times a day (TID) | ORAL | 0 refills | Status: DC | PRN

## 2024-01-23 NOTE — Telephone Encounter (Signed)
 Prescription Request  01/23/2024  LOV: 12/15/2023  What is the name of the medication or equipment? methocarbamol  (ROBAXIN ) 500 MG tablet   Have you contacted your pharmacy to request a refill? No   Which pharmacy would you like this sent to?  [facility] - [place], [place] - [address] [place] [address] Phone: [phone] Fax: [phone]  [id number] - [place], [place] - [address] AT [facility] [address] [place] [address] Phone: [phone] Fax: [phone]  [id number] [place], [place] - [address] [place] [address] Phone: [phone] Fax: [phone]  [facility] [address] [place] [address] Phone: [phone] Fax: [phone]    Patient notified that their request is being sent to the clinical staff for review and that they should receive a response within 2 business days.   Please advise at [facility] [phone] [place]

## 2024-01-23 NOTE — Telephone Encounter (Signed)
 Patient notified

## 2024-01-24 ENCOUNTER — Ambulatory Visit
Admission: RE | Admit: 2024-01-24 | Discharge: 2024-01-24 | Disposition: A | Discharge status: Home / Self Care | Payer: Medicare (Managed Care) | Source: Ambulatory Visit | Attending: Orthopedic Surgery | Admitting: Orthopedic Surgery

## 2024-01-24 ENCOUNTER — Ambulatory Visit (INDEPENDENT_AMBULATORY_CARE_PROVIDER_SITE_OTHER): Payer: Medicare (Managed Care) | Admitting: Orthopedic Surgery

## 2024-01-24 ENCOUNTER — Encounter: Payer: Self-pay | Admitting: Orthopedic Surgery

## 2024-01-24 ENCOUNTER — Ambulatory Visit
Admission: RE | Admit: 2024-01-24 | Discharge: 2024-01-24 | Disposition: A | Discharge status: Home / Self Care | Payer: Medicare (Managed Care) | Attending: Orthopedic Surgery | Admitting: Orthopedic Surgery

## 2024-01-24 VITALS — BP 124/76 | Ht <= 58 in | Wt 199.0 lb

## 2024-01-24 DIAGNOSIS — Z981 Arthrodesis status: Secondary | ICD-10-CM

## 2024-01-24 DIAGNOSIS — G5602 Carpal tunnel syndrome, left upper limb: Secondary | ICD-10-CM

## 2024-01-24 DIAGNOSIS — M4316 Spondylolisthesis, lumbar region: Secondary | ICD-10-CM | POA: Diagnosis present

## 2024-01-24 NOTE — Patient Instructions (Signed)
 It was nice to see you today.   I am glad that you are feeling better!   Continue with PT. Let us  know if they need more orders.   You have severe carpal tunnel in the left hand. I recommend seeing Dr. Claudene to discuss treatment for this. Let me know if you want to schedule this. I will message you in a month to check on you.    Dr. Clois will see you back in 6 months with repeat xrays.    Please call with any questions or concerns.   Glade Boys PA-C [phone]     The physicians and staff at [facility] at [facility] are committed to providing excellent care. You may receive a survey asking for feedback about your experience at our office. We value you your feedback and appreciate you taking the time to to fill it out. The [facility] leadership team is also available to discuss your experience in person, feel free to contact us  [phone].

## 2024-02-27 ENCOUNTER — Telehealth: Payer: Self-pay

## 2024-02-27 MED ORDER — GABAPENTIN 300 MG PO CAPS: 300.0000 mg | ORAL_CAPSULE | Freq: Three times a day (TID) | ORAL | 0 refills | Status: DC

## 2024-02-27 MED ORDER — METHOCARBAMOL 500 MG PO TABS: 500.0000 mg | ORAL_TABLET | Freq: Three times a day (TID) | ORAL | 0 refills | Status: DC | PRN

## 2024-02-27 NOTE — Telephone Encounter (Signed)
 Patient states she was advised to call office and let provider know if she wants to stay on Gabapentin . She does, as she feels it does help her. She would also like refill on Robaxin .   [facility]

## 2024-02-27 NOTE — Telephone Encounter (Signed)
 Prescriptions refilled.

## 2024-02-27 NOTE — Telephone Encounter (Signed)
 Asking for refill on Gabapentin  and Robaxin 

## 2024-02-28 NOTE — Telephone Encounter (Signed)
 Patient advised.

## 2024-03-07 ENCOUNTER — Telehealth: Payer: Self-pay | Admitting: Orthopedic Surgery

## 2024-03-07 MED ORDER — METHYLPREDNISOLONE 4 MG PO TBPK: ORAL_TABLET | ORAL | 0 refills | Status: DC

## 2024-03-07 NOTE — Telephone Encounter (Signed)
 Patient called stating that she had a visit at physical therapy yesterday and believes the weights she used may have been too heavy for her. She is now having lower right hip pain, slight leg pain. States her leg was hurting prior to the event but her hip pain is something new.  Patient is currently taking muscle relaxer, gabapentin , and advil dual strength for her pain. She states it hurts more while walking/bending. She tried to ice the location spot but the relief did not last very long.  Please advise

## 2024-03-07 NOTE — Telephone Encounter (Signed)
 Error duplicate encounter

## 2024-03-07 NOTE — Telephone Encounter (Signed)
 I spoke with the patient and reviewed Stacy's message. She would like to try the steroids. She has PT again tomorrow, so I encouraged her to discuss her increased pain with the physical therapist. I also advised that she cannot take advil or any antiinflammatory medications while taking the steroids, but that she can take tylenol  (if that is something she can normally take) as well as continue the muscle relaxer and gabapentin . She verbalized understanding. I sent the rx for a medrol  dosepack to [facility] per her request.

## 2024-03-07 NOTE — Telephone Encounter (Signed)
 DOS: 11/02/23 MIS L4-S1 TLIF   If she over did it in PT, pain should improve over next few days. If pain is severe, we can do a steroid dose pack to help with pain/inflammation.   Does she want to try this?

## 2024-03-07 NOTE — Addendum Note (Signed)
 Addended by: Cierrah Dace on: 03/07/2024 06:14 PM   Modules accepted: Orders

## 2024-04-02 ENCOUNTER — Telehealth: Payer: Self-pay | Admitting: Neurosurgery

## 2024-04-02 MED ORDER — GABAPENTIN 300 MG PO CAPS: 300.0000 mg | ORAL_CAPSULE | Freq: Three times a day (TID) | ORAL | 0 refills | Status: DC

## 2024-04-02 NOTE — Telephone Encounter (Signed)
 I informed patient of the message below. She is wanting to continue the Gabapentin . She states it is helping and she had missed a dose 2 times last week and could tell a difference. She is requesting a refill.

## 2024-04-02 NOTE — Telephone Encounter (Signed)
 Glad she is not hurting more after the fall.   If gabapentin  is helping then I recommend she continue it.   If she wants to try to stop it, she can take 300mg  bid x 3 days, then 300mg  q hs x 3 days, then stop it.   Let me know how she wants to proceed and if she needs a refill.

## 2024-04-02 NOTE — Addendum Note (Signed)
 Addended byBETHA HILMA HASTINGS on: 04/02/2024 12:47 PM   Modules accepted: Orders

## 2024-04-02 NOTE — Telephone Encounter (Signed)
 Patient called the office to report she had a fall last Wednesday, September 10th. She states she was out of town at that time so she did not call us  sooner but she fell on her right side/back. States she is not having any new pain that she did not have before- also states her leg is stiff but she wasn't not actively moving when out of town. She wanted to give an FYI to the provider.  Also states she is almost out of gabapentin - she is not sure if the provider is going to refill the medication or if she is supposed to slowly stop taking this prescription, please advise.

## 2024-04-02 NOTE — Telephone Encounter (Signed)
 Gabapentin  refill send to her pharmacy. Please let her know.

## 2024-04-09 ENCOUNTER — Telehealth: Payer: Self-pay | Admitting: Neurosurgery

## 2024-04-09 NOTE — Telephone Encounter (Signed)
 DOS: 11/02/23 MIS L4-S1 TLIF   Refill of robaxin  okay and sent to pharmacy. Please let her know.

## 2024-04-09 NOTE — Telephone Encounter (Signed)
 Patient notified and expressed understanding.

## 2024-05-07 ENCOUNTER — Encounter: Payer: Self-pay | Admitting: Neurosurgery

## 2024-05-07 ENCOUNTER — Telehealth: Payer: Self-pay

## 2024-05-07 ENCOUNTER — Other Ambulatory Visit: Payer: Self-pay | Admitting: Orthopedic Surgery

## 2024-05-07 DIAGNOSIS — M4316 Spondylolisthesis, lumbar region: Secondary | ICD-10-CM

## 2024-05-07 DIAGNOSIS — M48062 Spinal stenosis, lumbar region with neurogenic claudication: Secondary | ICD-10-CM

## 2024-05-07 NOTE — Telephone Encounter (Signed)
 DOS: 11/02/23 MIS L4-S1 TLIF   Refill of neurontin  sent. Will let patient know in other encounter from today.

## 2024-05-07 NOTE — Telephone Encounter (Signed)
 She said that she did not notice a difference last time she took it. She just picked up her gabapentin  refill. Put her with you on the schedule next weds.

## 2024-05-07 NOTE — Telephone Encounter (Signed)
 She did a medrol  dose pack on 8/20- did that help at all?   If so, could repeat it. Let me know.   I sent a refill of her neurontin  in another encounter.   I would recommend having her come in to see a PA if she is still hurting.

## 2024-05-07 NOTE — Telephone Encounter (Signed)
 DOS: 11/02/23 MIS L4-S1 TLIF   She had a fall 9/10 - has not gotten worse since, no lasting complications  Patient calling because she is still having a lot of pain in her right leg from hip to knee. Some days it is not bad at all (3-4/10), some days can hardly stand up/etc (7-8/10). She says that more days than not she has these exacerbations. She is not doing PT anymore because insurance is not approving any more. This pain has been going on since prior to surgery. She said that at one point it started to get better, but now is back to being the same as it was before. She did note that she had a stroke 3 years ago and has never gotten her strength back in her right side. She said she has had ultrasounds, MRI, etc, and nothing was ever found in her right leg. She says that her meds help, but do not take away the pain.  Pain regimen: Ice, heat - helps ease off, but doesn't stay Icy hot/biofreeze - helps sometimes Celebrex  - quit  Aleve - 1 every 12 hours - some days (makes her sleepy, so she sometimes takes at night) Gabapentin  - 1 in the morning, 2 in the evening Robaxin  - 3 times a day (every 6-8 hours)  Tylenol  - 1000mg  every 6-8 hours OR Advil dual strength (tylenol  500/ibuprofen 250) -  every 6-8 hours **I advised her not to take Advil dual strength with tylenol  and advised her it may be more beneficial to take tylenol  and ibuprofen (as directed), alternating**  She needs a refill on her Gabapentin  She is asking for recommendations and is wondering if she needs to be seen for another appointment. She wants to know what is going on with her leg

## 2024-05-08 NOTE — Telephone Encounter (Signed)
 Yes, please schedule her for lumbar xray AP/lat prior to seeing me.

## 2024-05-09 NOTE — Addendum Note (Signed)
 Addended by: GIRARD DON GAILS on: 05/09/2024 08:31 AM   Modules accepted: Orders

## 2024-05-13 NOTE — Progress Notes (Unsigned)
 Referring Physician:  Katrinka Aquas, MD [address] [place],  [place] [address]  Primary Physician:  Katrinka Aquas, MD  History of Present Illness: [name] has a history of HTN, stroke.   She is s/p MIS TLIF L4-S1 by Dr. Clois on 11/02/23.   She was doing well at her last visit on 12/15/23. She called last week with increased pain in her right leg.    She has more constant right buttock pain with lateral and anterior leg pain to her knee that is worse with moving or bending x 2 months. Also worse with standing and walking. She has good days and bad days. She has some improvement with rest.   She has been taking neurontin , prn robaxin .   She would like a refill of robaxin .   Tobacco use: Does not smoke.   Bowel/Bladder Dysfunction: none  The symptoms are causing a significant impact on the patient's life.   Review of Systems:  A 10 point review of systems is negative, except for the pertinent positives and negatives detailed in the HPI.  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   Spinal stenosis    Stroke [facility])    2022    Past Surgical History: Past Surgical History:  Procedure Laterality Date   APPLICATION OF INTRAOPERATIVE CT SCAN N/A 11/02/2023   Procedure: APPLICATION OF INTRAOPERATIVE CT SCAN;  Surgeon: Clois Fret, MD;  Location: [facility] ORS;  Service: Neurosurgery;  Laterality: N/A;   BREAST CYST EXCISION Left 07/19/2008   CESAREAN SECTION  07/19/1982   NOVASURE ABLATION  07/19/2008   ROTATOR CUFF REPAIR Right    TRANSFORAMINAL LUMBAR INTERBODY FUSION W/ MIS 2 LEVEL N/A 11/02/2023   Procedure: L4-S1 MINIMALLY INVASIVE (MIS) TRANSFORAMINAL LUMBAR INTERBODY FUSION (TLIF);  Surgeon: Clois Fret, MD;  Location: [facility] ORS;  Service: Neurosurgery;  Laterality: N/A;    Allergies: Allergies as of 05/16/2024 - Review Complete 05/16/2024  Allergen Reaction Noted   Cashew nut oil Anaphylaxis, Rash, 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   fluticasone  (FLONASE ) 50 MCG/ACT nasal spray Place 1 spray into both nostrils daily for 14 days. (Patient taking differently: Place 1 spray into both nostrils daily as needed for allergies.)   gabapentin  (NEURONTIN ) 300 MG capsule TAKE ONE CAPSULE BY MOUTH THREE TIMES DAILY   hydrochlorothiazide  (HYDRODIURIL ) 25 MG tablet Take 25 mg by mouth daily.   methocarbamol  (ROBAXIN ) 500 MG tablet TAKE ONE TABLET BY MOUTH EVERY 8 HOURS AS NEEDED FOR MUSCLE SPASMS   oxyCODONE  (OXY IR/ROXICODONE ) 5 MG immediate release tablet Take 5 mg by mouth every 4 (four) hours as needed.   pseudoephedrine  (SUDAFED) 30 MG tablet Take 30 mg by mouth daily as needed for congestion.   [DISCONTINUED] celecoxib  (CELEBREX ) 200 MG capsule Take 1 capsule (200 mg total) by mouth 2 (two) times daily.   [DISCONTINUED] chlorhexidine  (HIBICLENS ) 4 % external liquid Apply 15 mLs (1 Application total) topically as directed for 30 doses. Use as directed daily for 5  days every other week for 6 weeks.   [DISCONTINUED] methylPREDNISolone  (MEDROL  DOSEPAK) 4 MG TBPK tablet Take by mouth daily - taper daily dose per package instructions.   [DISCONTINUED] CAMILA 0.35 MG tablet Take 1 tablet by mouth daily.   No facility-administered encounter medications on file as of 05/16/2024.    Social History: Social History   Tobacco Use   Smoking status: Former    Types: Cigarettes   Smokeless tobacco: Never   Tobacco comments:    Quit 2015  Substance Use Topics   Alcohol use:  Yes   Drug use: No    Family Medical History: No family history on file.  Physical Examination: Vitals:   05/16/24 1324  BP: 118/78      Awake, alert, oriented to person, place, and time.  Speech is clear and fluent. Fund of knowledge is appropriate.   Cranial Nerves: Pupils equal round and reactive to light.  Facial tone is symmetric.    Well healed lumbar incisions. Mild right buttock tenderness.   No abnormal lesions on exposed skin.   Strength: Side Iliopsoas Quads Hamstring PF DF EHL  R 5 5 5 5 5 5   L 5 5 5 5 5 5    Bilateral lower extremity sensation is intact to light touch.     Gait is normal.     Medical Decision Making  Imaging: Lumbar xrays dated 05/16/24:  No complications noted.  Report for above xrays not yet available.   I have personally reviewed the images and agree with the above interpretation.  Assessment and Plan: [name] more constant right buttock pain with lateral and anterior leg pain to her knee that is worse with moving or bending x 2 months. She has good days and bad days.   Lumbar xrays show no complications.   Treatment options discussed with patient and following plan made:   - Restart PT for lumbar spine at [facility]. Orders sent.  - She can start walking in pool at [facility].  - Medrol  dose pack sent to help with symptoms. Reviewed dosing and side effects.  - Stop robaxin . Flexeril sent to pharmacy. Reviewed dosing and side effects. Can make her sleepy.  - Will message her next week to check on her progress. If no better, can give PT time or get updated lumbar MRI.   I spent a total of 25 minutes in face-to-face and non-face-to-face activities related to this patient's care today including review of outside records, review of imaging, review of symptoms, physical exam, discussion of differential diagnosis, discussion of treatment options, and documentation.   Glade Boys PA-C Dept. of Neurosurgery

## 2024-05-15 ENCOUNTER — Other Ambulatory Visit: Payer: Self-pay

## 2024-05-15 DIAGNOSIS — M4316 Spondylolisthesis, lumbar region: Secondary | ICD-10-CM

## 2024-05-16 ENCOUNTER — Encounter: Payer: Self-pay | Admitting: Orthopedic Surgery

## 2024-05-16 ENCOUNTER — Ambulatory Visit: Payer: Medicare (Managed Care) | Admitting: Orthopedic Surgery

## 2024-05-16 ENCOUNTER — Ambulatory Visit (INDEPENDENT_AMBULATORY_CARE_PROVIDER_SITE_OTHER): Payer: Medicare (Managed Care)

## 2024-05-16 VITALS — BP 118/78 | Wt 214.6 lb

## 2024-05-16 DIAGNOSIS — M5416 Radiculopathy, lumbar region: Secondary | ICD-10-CM | POA: Diagnosis not present

## 2024-05-16 DIAGNOSIS — Z981 Arthrodesis status: Secondary | ICD-10-CM

## 2024-05-16 DIAGNOSIS — M48062 Spinal stenosis, lumbar region with neurogenic claudication: Secondary | ICD-10-CM | POA: Diagnosis not present

## 2024-05-16 DIAGNOSIS — M4316 Spondylolisthesis, lumbar region: Secondary | ICD-10-CM | POA: Diagnosis not present

## 2024-05-16 MED ORDER — CYCLOBENZAPRINE HCL 10 MG PO TABS: 10.0000 mg | ORAL_TABLET | Freq: Three times a day (TID) | ORAL | 0 refills | Status: DC | PRN

## 2024-05-16 MED ORDER — METHYLPREDNISOLONE 4 MG PO TBPK: ORAL_TABLET | ORAL | 0 refills | Status: DC

## 2024-05-16 NOTE — Patient Instructions (Signed)
 It was so nice to see you today. Thank you so much for coming in.    Your xrays showed that your fusion looks good.   I sent physical therapy orders to [facility]. You can call them at [phone] if you don't hear from them to schedule your visit.   I sent a prescription for a steroid pain to help with pain and inflammation. Take as directed. No tylenol , motrin, or aleve while on this medication.   I also sent a prescription for cyclobenzaprine to help with muscle spasms. Use only as needed and be careful, this can make you sleepy. Stop the methocarbamol .   I will message you next week to check on you. If no improvement, may consider lumbar MRI.   Please do not hesitate to call if you have any questions or concerns. You can also message me in MyChart.   Glade Boys PA-C [phone]     The physicians and staff at [facility] at [facility] are committed to providing excellent care. You may receive a survey asking for feedback about your experience at our office. We value you your feedback and appreciate you taking the time to to fill it out. The [facility] leadership team is also available to discuss your experience in person, feel free to contact us  [phone].

## 2024-05-21 ENCOUNTER — Telehealth: Payer: Self-pay | Admitting: Orthopedic Surgery

## 2024-05-21 NOTE — Telephone Encounter (Signed)
 She can take a tylenol  if needed for headache, but would not take regularly with steroids. Stop if she has any GI upset.

## 2024-05-21 NOTE — Telephone Encounter (Signed)
 Patient is wanting to know what she can take for a headache while on steroids.  She stated Stacy told her not to take any tylenol .

## 2024-05-21 NOTE — Telephone Encounter (Signed)
 Patient notified and will take one.

## 2024-05-24 DIAGNOSIS — Z981 Arthrodesis status: Secondary | ICD-10-CM

## 2024-05-24 DIAGNOSIS — M5416 Radiculopathy, lumbar region: Secondary | ICD-10-CM

## 2024-05-24 DIAGNOSIS — M4316 Spondylolisthesis, lumbar region: Secondary | ICD-10-CM

## 2024-05-24 MED ORDER — CELECOXIB 200 MG PO CAPS: 200.0000 mg | ORAL_CAPSULE | Freq: Every day | ORAL | 0 refills | Status: DC

## 2024-05-29 NOTE — Telephone Encounter (Signed)
 She is s/p MIS TLIF L4-S1 by Dr. Clois on 11/02/23.   Lumbar MRI ordered.

## 2024-05-29 NOTE — Addendum Note (Signed)
 Addended by: HILMA HASTINGS on: 05/29/2024 12:51 PM   Modules accepted: Orders

## 2024-05-30 MED ORDER — DIAZEPAM 5 MG PO TABS: ORAL_TABLET | ORAL | 0 refills | Status: DC

## 2024-05-30 NOTE — Addendum Note (Signed)
 Addended byBETHA HILMA HASTINGS on: 05/30/2024 01:22 PM   Modules accepted: Orders

## 2024-06-08 ENCOUNTER — Ambulatory Visit
Admission: RE | Admit: 2024-06-08 | Discharge: 2024-06-08 | Disposition: A | Discharge status: Home / Self Care | Payer: Medicare (Managed Care) | Source: Ambulatory Visit | Attending: Orthopedic Surgery | Admitting: Orthopedic Surgery

## 2024-06-08 DIAGNOSIS — M4316 Spondylolisthesis, lumbar region: Secondary | ICD-10-CM

## 2024-06-08 DIAGNOSIS — M5416 Radiculopathy, lumbar region: Secondary | ICD-10-CM

## 2024-06-08 DIAGNOSIS — Z981 Arthrodesis status: Secondary | ICD-10-CM

## 2024-06-18 NOTE — Progress Notes (Unsigned)
 Telephone Visit- Progress Note: Referring Physician:  Katrinka Aquas, MD [address] [place],  [place] [address]  Primary Physician:  Katrinka Aquas, MD  This visit was performed via telephone.  Patient location: home Provider location: working from home  I spent a total of 15 minutes non-face-to-face activities for this visit on the date of this encounter including review of current clinical condition and response to treatment.    Patient has given verbal consent to this telephone visits and we reviewed the limitations of a telephone visit. Patient wishes to proceed.    Chief Complaint:  review imaging  History of Present Illness: Meredith Weber is a 63 y.o. female has a history of HTN, stroke.    She is s/p MIS TLIF L4-S1 by Dr. Clois on 11/02/23.    Last seen by me on 04/2924 with increased pain in right buttock and right anterior/lateral leg to her foot. Previous lumbar xrays showed no complications. No significant relief with steroid dose pack.   Phone visit scheduled to review her lumbar MRI scan.   She has been sick this week with cold/flu so overall is feeling a little worse.   She has more constant right buttock pain with lateral and anterior leg pain to her knee. No left leg pain. LBP/buttock pain = right leg pain.  Pain is worse with bending, standing, and walking.   She was scheduled to start PT 06/18/24 and cancelled due to being sick.    She has been out of neurontin  for a few weeks. She needs refill of celebrex . Does not think flexeril  helped and would like to go back to robaxin .    Tobacco use: Does not smoke.    Bowel/Bladder Dysfunction: none   The symptoms are causing a significant impact on the patient's life.    Exam: No exam done as this was a telephone encounter.     Imaging: Lumbar MRI dated 06/08/24:  FINDINGS:   BONES AND ALIGNMENT: 5 lumbar type vertebrae. Normal alignment. No fracture, suspicious marrow lesion, or  significant marrow edema. Interval L4-S1 posterior and interbody fusion.   SPINAL CORD: The conus medullaris terminates at T12-L1 and is normal in signal.   SOFT TISSUES: Postoperative changes in the posterior lower lumbar soft tissues. No fluid collection.   DISC LEVELS: Diffuse disc desiccation. Moderate disc space narrowing at T10-T11 and T11-T12 and up to mild narrowing in the upper lumbar spine. Diffuse congenital narrowing of the lumbar spinal canal due to short pedicles.   T11-T12: Only imaged sagittally. Disc bulging and mild facet arthrosis without evidence of significant stenosis, unchanged.   T12-L1: Minimal disc bulging and mild facet hypertrophy without stenosis, unchanged.   L1-L2: Mild facet and ligamentum flavum hypertrophy without disc herniation or stenosis, unchanged.   L2-L3: Disc bulging and moderate facet and ligamentum flavum hypertrophy result in mild spinal stenosis without neural foraminal stenosis, unchanged.   L3-L4: Disc bulging and moderate facet and ligamentum flavum hypertrophy result in borderline spinal stenosis without neural foraminal stenosis, unchanged.   L4-L5: Interval fusion. No evidence of significant residual stenosis within limitations of susceptibility artifact.   L5-S1: Interval fusion. Disc bulging and facet hypertrophy likely result in mild residual spinal stenosis and left neural foraminal stenosis, with assessment limited by susceptibility artifact.   IMPRESSION: 1. Interval L4-S1 fusion. Suspected mild residual spinal stenosis and left neural foraminal stenosis at L5-S1, with assessment limited by artifact. 2. Unchanged mild spinal stenosis at L2-L3.   Electronically signed by:  Dasie Hamburg MD 06/12/2024 05:09 PM EST RP Workstation: [id number]      I have personally reviewed the images and agree with the above interpretation.  Assessment and Plan: She has been sick this week with cold/flu so overall is feeling a  little worse.   She has more constant right buttock pain with lateral and anterior leg pain to her knee. No left leg pain. LBP/buttock pain = right leg pain.     Lumbar xrays show no complications. She has known lumbar spondylosis with mild central stenosis L2-L3 and borderline central stenosis L3-L4.    Treatment options discussed with patient and following plan made:   - She will start PT once she is feeling better.  - We discussed referral back to PMR at [facility] for possible injections and she declines for now.  - Continue on celebrex - refill sent to pharmacy.  - She has been out of neurontin  for a few weeks. Will restart 300mg  tid. She will work up to tid. Reviewed dosing and side effects.  - No relief with flexeril . She thinks robaxin  helped more. Refill of robaxin  to pharmacy. She will stop flexeril .  - Follow up with Dr. Clois as scheduled on 07/26/24.   Glade Boys PA-C Neurosurgery

## 2024-06-22 ENCOUNTER — Encounter: Payer: Self-pay | Admitting: Orthopedic Surgery

## 2024-06-22 ENCOUNTER — Ambulatory Visit: Payer: Medicare (Managed Care) | Admitting: Orthopedic Surgery

## 2024-06-22 DIAGNOSIS — M47816 Spondylosis without myelopathy or radiculopathy, lumbar region: Secondary | ICD-10-CM

## 2024-06-22 DIAGNOSIS — M48061 Spinal stenosis, lumbar region without neurogenic claudication: Secondary | ICD-10-CM

## 2024-06-22 DIAGNOSIS — Z981 Arthrodesis status: Secondary | ICD-10-CM

## 2024-06-22 DIAGNOSIS — M4726 Other spondylosis with radiculopathy, lumbar region: Secondary | ICD-10-CM

## 2024-06-22 DIAGNOSIS — M5416 Radiculopathy, lumbar region: Secondary | ICD-10-CM

## 2024-06-22 MED ORDER — METHOCARBAMOL 500 MG PO TABS: 500.0000 mg | ORAL_TABLET | Freq: Three times a day (TID) | ORAL | 0 refills | Status: DC | PRN

## 2024-06-22 MED ORDER — GABAPENTIN 300 MG PO CAPS: ORAL_CAPSULE | ORAL | 0 refills | Status: DC

## 2024-06-22 MED ORDER — CELECOXIB 200 MG PO CAPS: 200.0000 mg | ORAL_CAPSULE | Freq: Every day | ORAL | 0 refills | Status: AC

## 2024-07-17 ENCOUNTER — Other Ambulatory Visit: Payer: Self-pay

## 2024-07-17 DIAGNOSIS — M5416 Radiculopathy, lumbar region: Secondary | ICD-10-CM

## 2024-07-26 ENCOUNTER — Ambulatory Visit
Admission: RE | Admit: 2024-07-26 | Discharge: 2024-07-26 | Disposition: A | Discharge status: Home / Self Care | Attending: Neurosurgery | Admitting: Neurosurgery

## 2024-07-26 ENCOUNTER — Other Ambulatory Visit: Payer: Medicare (Managed Care)

## 2024-07-26 ENCOUNTER — Ambulatory Visit (INDEPENDENT_AMBULATORY_CARE_PROVIDER_SITE_OTHER): Payer: Medicare (Managed Care) | Admitting: Neurosurgery

## 2024-07-26 ENCOUNTER — Encounter: Payer: Self-pay | Admitting: Neurosurgery

## 2024-07-26 ENCOUNTER — Ambulatory Visit
Admission: RE | Admit: 2024-07-26 | Discharge: 2024-07-26 | Disposition: A | Discharge status: Home / Self Care | Source: Ambulatory Visit | Attending: Neurosurgery | Admitting: Neurosurgery

## 2024-07-26 DIAGNOSIS — M48061 Spinal stenosis, lumbar region without neurogenic claudication: Secondary | ICD-10-CM | POA: Diagnosis not present

## 2024-07-26 DIAGNOSIS — Z981 Arthrodesis status: Secondary | ICD-10-CM

## 2024-07-26 DIAGNOSIS — M5416 Radiculopathy, lumbar region: Secondary | ICD-10-CM

## 2024-07-26 DIAGNOSIS — M47816 Spondylosis without myelopathy or radiculopathy, lumbar region: Secondary | ICD-10-CM

## 2024-07-26 NOTE — Progress Notes (Signed)
" ° °  REFERRING PHYSICIAN:  Katrinka Aquas, Md [address] [place],  [place] [address]  DOS: 11/02/23  MIS L4-S1 TLIF  HISTORY OF PRESENT ILLNESS: Meredith Weber is status post above surgery.   She unfortunately continues to have some right greater than left leg pain.  She is better than she was prior to surgery, but still is suffering somewhat.    PHYSICAL EXAMINATION:  Vitals:   07/26/24 1027  BP: 124/88    General: Patient is well developed, well nourished, calm, collected, and in no apparent distress.   NEUROLOGICAL:  General: In no acute distress.   Awake, alert, oriented to person, place, and time.  Pupils equal round and reactive to light.  Facial tone is symmetric.     Strength:            Side Iliopsoas Quads Hamstring PF DF EHL  R 5 5 5  4+ 5 4+  L 5 5 5 5 5 5    Incision c/d/i   ROS (Neurologic):  Negative except as noted above  IMAGING: Stable findings  ASSESSMENT/PLAN:  Meredith Weber is doing fair s/p above surgery.  She is better than she was preop, but has not achieved the results we were hoping for.  She has some element of postlaminectomy syndrome.  We discussed the possibility of a spinal cord stimulator evaluation.  Her husband is going through some health issues currently, so she would like to wait until his courses were clear.  I have given her some information to read about this.  She will let us  know if she would like to move forward.  If she does wish to move forward with stimulator evaluation, we will send her for thoracic spine MRI, psychology evaluation, and refer her to the pain clinic.  I spent a total of 10 minutes in this patient's care today. This time was spent reviewing pertinent records including imaging studies, obtaining and confirming history, performing a directed evaluation, formulating and discussing my recommendations, and documenting the visit within the medical record.   Reeves Daisy MD Department of neurosurgery "

## 2024-08-07 ENCOUNTER — Other Ambulatory Visit: Payer: Self-pay | Admitting: Orthopedic Surgery

## 2024-08-07 DIAGNOSIS — M48061 Spinal stenosis, lumbar region without neurogenic claudication: Secondary | ICD-10-CM

## 2024-08-07 DIAGNOSIS — M5416 Radiculopathy, lumbar region: Secondary | ICD-10-CM

## 2024-08-07 DIAGNOSIS — Z981 Arthrodesis status: Secondary | ICD-10-CM

## 2024-08-07 DIAGNOSIS — M47816 Spondylosis without myelopathy or radiculopathy, lumbar region: Secondary | ICD-10-CM

## 2024-08-21 ENCOUNTER — Telehealth: Payer: Self-pay | Admitting: Orthopedic Surgery

## 2024-08-21 DIAGNOSIS — M48061 Spinal stenosis, lumbar region without neurogenic claudication: Secondary | ICD-10-CM

## 2024-08-21 DIAGNOSIS — Z981 Arthrodesis status: Secondary | ICD-10-CM

## 2024-08-21 DIAGNOSIS — M47816 Spondylosis without myelopathy or radiculopathy, lumbar region: Secondary | ICD-10-CM

## 2024-08-21 DIAGNOSIS — M5416 Radiculopathy, lumbar region: Secondary | ICD-10-CM

## 2024-08-21 MED ORDER — GABAPENTIN 300 MG PO CAPS: 300.0000 mg | ORAL_CAPSULE | Freq: Three times a day (TID) | ORAL | 0 refills | Status: AC

## 2024-08-21 NOTE — Addendum Note (Signed)
 Addended byBETHA HILMA HASTINGS on: 08/21/2024 03:33 PM   Modules accepted: Orders

## 2024-08-21 NOTE — Telephone Encounter (Signed)
 Received gabapentin  refill from pharmacy.   Was given 30 day supply of gabapentin  on 06/22/24 (if taking 300mg  tid).   She should have been out over a month ago.   Please call and find out how much gabapentin  she is taking and how often. Also is she taking regularly or prn?   Will send gabapentin  refill back denied for now.

## 2024-08-21 NOTE — Telephone Encounter (Signed)
 I left a message for the patient to call back

## 2024-08-21 NOTE — Telephone Encounter (Signed)
 Refill for gabapentin  sent to pharmacy. Recommend she continue 300mg  tid. Please let her know.   Also, she does not have f/u appt scheduled with us . She will need to get further refills of gabapentin  from her PCP.

## 2024-08-22 NOTE — Telephone Encounter (Signed)
 Patient advised and will ask PCP for refills next time
